# Patient Record
Sex: Male | Born: 1935 | Race: White | Hispanic: No | Marital: Married | Smoking: Former smoker
Health system: Southern US, Community
[De-identification: ages and names within clinical notes are randomized; demographics above are authoritative.]

## PROBLEM LIST (undated history)

## (undated) DIAGNOSIS — K219 Gastro-esophageal reflux disease without esophagitis: Secondary | ICD-10-CM

## (undated) DIAGNOSIS — N189 Chronic kidney disease, unspecified: Secondary | ICD-10-CM

## (undated) DIAGNOSIS — D649 Anemia, unspecified: Secondary | ICD-10-CM

## (undated) DIAGNOSIS — E7800 Pure hypercholesterolemia, unspecified: Secondary | ICD-10-CM

## (undated) DIAGNOSIS — Z950 Presence of cardiac pacemaker: Secondary | ICD-10-CM

## (undated) DIAGNOSIS — I739 Peripheral vascular disease, unspecified: Secondary | ICD-10-CM

## (undated) DIAGNOSIS — N529 Male erectile dysfunction, unspecified: Secondary | ICD-10-CM

## (undated) DIAGNOSIS — R0600 Dyspnea, unspecified: Secondary | ICD-10-CM

## (undated) DIAGNOSIS — E119 Type 2 diabetes mellitus without complications: Secondary | ICD-10-CM

## (undated) DIAGNOSIS — K5790 Diverticulosis of intestine, part unspecified, without perforation or abscess without bleeding: Secondary | ICD-10-CM

## (undated) HISTORY — DX: Male erectile dysfunction, unspecified: N52.9

## (undated) HISTORY — PX: OTHER SURGICAL HISTORY: SHX169

## (undated) HISTORY — DX: Anemia, unspecified: D64.9

## (undated) HISTORY — DX: Chronic kidney disease, unspecified: N18.9

## (undated) HISTORY — DX: Diverticulosis of intestine, part unspecified, without perforation or abscess without bleeding: K57.90

---

## 2005-01-11 ENCOUNTER — Ambulatory Visit: Payer: Self-pay | Admitting: Gastroenterology

## 2005-02-02 ENCOUNTER — Ambulatory Visit: Payer: Self-pay | Admitting: Gastroenterology

## 2008-01-08 ENCOUNTER — Ambulatory Visit: Payer: Self-pay | Admitting: Gastroenterology

## 2008-04-02 ENCOUNTER — Ambulatory Visit: Payer: Self-pay | Admitting: Unknown Physician Specialty

## 2009-10-05 ENCOUNTER — Ambulatory Visit: Payer: Self-pay | Admitting: Ophthalmology

## 2009-12-27 ENCOUNTER — Ambulatory Visit: Payer: Self-pay | Admitting: Cardiovascular Disease

## 2009-12-27 ENCOUNTER — Ambulatory Visit: Payer: Self-pay | Admitting: Urology

## 2010-01-04 ENCOUNTER — Ambulatory Visit: Payer: Self-pay | Admitting: Internal Medicine

## 2010-01-05 ENCOUNTER — Ambulatory Visit: Payer: Self-pay | Admitting: Internal Medicine

## 2010-01-12 ENCOUNTER — Ambulatory Visit: Payer: Self-pay | Admitting: Urology

## 2010-02-01 ENCOUNTER — Ambulatory Visit: Payer: Self-pay | Admitting: Internal Medicine

## 2011-09-20 ENCOUNTER — Ambulatory Visit: Payer: Self-pay | Admitting: Internal Medicine

## 2011-09-28 ENCOUNTER — Ambulatory Visit: Payer: Self-pay | Admitting: Internal Medicine

## 2011-10-30 ENCOUNTER — Ambulatory Visit: Payer: Self-pay | Admitting: Gastroenterology

## 2011-11-02 LAB — PATHOLOGY REPORT

## 2012-12-04 HISTORY — PX: HERNIA REPAIR: SHX51

## 2013-11-11 ENCOUNTER — Ambulatory Visit: Payer: Self-pay | Admitting: General Surgery

## 2013-12-15 ENCOUNTER — Ambulatory Visit: Payer: Self-pay | Admitting: Surgery

## 2013-12-15 LAB — CBC WITH DIFFERENTIAL/PLATELET
BASOS ABS: 0 10*3/uL (ref 0.0–0.1)
Basophil %: 0.7 %
EOS ABS: 0.1 10*3/uL (ref 0.0–0.7)
Eosinophil %: 1.4 %
HCT: 36.1 % — ABNORMAL LOW (ref 40.0–52.0)
HGB: 12.3 g/dL — ABNORMAL LOW (ref 13.0–18.0)
Lymphocyte #: 1.7 10*3/uL (ref 1.0–3.6)
Lymphocyte %: 32.7 %
MCH: 33.3 pg (ref 26.0–34.0)
MCHC: 34.1 g/dL (ref 32.0–36.0)
MCV: 98 fL (ref 80–100)
MONOS PCT: 8.8 %
Monocyte #: 0.4 x10 3/mm (ref 0.2–1.0)
NEUTROS ABS: 2.9 10*3/uL (ref 1.4–6.5)
Neutrophil %: 56.4 %
RBC: 3.7 10*6/uL — ABNORMAL LOW (ref 4.40–5.90)
RDW: 13.8 % (ref 11.5–14.5)
WBC: 5.1 10*3/uL (ref 3.8–10.6)

## 2013-12-15 LAB — BASIC METABOLIC PANEL
ANION GAP: 3 — AB (ref 7–16)
BUN: 14 mg/dL (ref 7–18)
CO2: 28 mmol/L (ref 21–32)
Calcium, Total: 9 mg/dL (ref 8.5–10.1)
Chloride: 108 mmol/L — ABNORMAL HIGH (ref 98–107)
Creatinine: 1.25 mg/dL (ref 0.60–1.30)
EGFR (African American): >60
EGFR (Non-African Amer.): 54 — ABNORMAL LOW
Glucose: 84 mg/dL (ref 65–99)
Osmolality: 277 (ref 275–301)
POTASSIUM: 4.2 mmol/L (ref 3.5–5.1)
Sodium: 139 mmol/L (ref 136–145)

## 2013-12-22 ENCOUNTER — Ambulatory Visit: Payer: Self-pay | Admitting: Surgery

## 2014-04-08 DIAGNOSIS — E782 Mixed hyperlipidemia: Secondary | ICD-10-CM | POA: Insufficient documentation

## 2014-04-08 DIAGNOSIS — K2100 Gastro-esophageal reflux disease with esophagitis, without bleeding: Secondary | ICD-10-CM | POA: Insufficient documentation

## 2014-10-14 DIAGNOSIS — D500 Iron deficiency anemia secondary to blood loss (chronic): Secondary | ICD-10-CM | POA: Insufficient documentation

## 2015-03-27 NOTE — Op Note (Signed)
PATIENT NAME:  Lance Diaz, Lance Diaz MR#:  [id number] DATE OF BIRTH:  13-Nov-1931  DATE OF PROCEDURE:  12/22/2013  PREOPERATIVE DIAGNOSIS: Symptomatic right inguinal hernia.   POSTOPERATIVE DIAGNOSIS: Right indirect inguinal hernia.   PROCEDURE PERFORMED: Robotically assisted laparoscopic right inguinal herniorrhaphy with mesh.   SURGEON: Sherri Rad, M.D.   ASSISTANT: Scrub techs   ANESTHESIA: General endotracheal and local.   ESTIMATED BLOOD LOSS: 25 mL.   DESCRIPTION OF PROCEDURE: With informed consent, supine position, general oral endotracheal anesthesia, the patient's abdomen was clipped of hair, prepped and draped with ChloraPrep solution. A Foley catheter was placed. The left arm was padded and tucked at his side. Timeout was observed.   A 12 mm blunt Hassan trocar was placed through a supraumbilical transversely oriented skin incision with stay sutures being passed through the fascia. Pneumoperitoneum was established. The patient was then positioned in Trendelenburg position. Right indirect inguinal hernia was identified. No evidence of a left inguinal hernia. Intra-abdominal organs to gross inspection appeared normal. An 8.5 mm da Vinci trocar was placed laterally on the aright side of the abdomen, 10 cm from the midline. An additional 8.5 mm trocar da Vinci was placed in the left mid abdomen at the umbilical line. A 10 mm Bladeless trocar was placed in the left upper quadrant for the assistant port. The robot was then docked. Instruments were then inserted under direct visualization. Remote sensors were identified and secured in the proper location. Camera was secured. Instruments were then advanced into the operative field under direct visualization. I then moved to the console.   The peritoneum beginning along the medial umbilical ligament was taken down from a medial to lateral orientation. The preperitoneal space was entered. Cooper's ligament was identified. Epigastric vessels were also  identified and preserved. There was no evidence of a direct inguinal hernia.   Dissection laterally demonstrated the course of the nerve. Muscle was identified. Sufficient preperitoneal dissection was undertaken to allow placement of the mesh. The vas and vessels were identified, as well as a large indirect inguinal hernia sac which was able to be reduced back out of the internal ring with gentle traction. Hernia sac was allowed to be reduced back into the abdominal space. Course of the vas and vessels were identified.   Medium 3-D Max Bard right-sided hernia mesh was brought into the field and inserted through the first assistant port. It was then unfurled with orientation for the right side. An 0 Vicryl suture was used to tack the mesh at two places along the shelving edge of Cooper's ligament. Two stitches were then placed in the anterior abdominal wall. Stitch was placed lateral to the epigastric vessels. No sutures were placed in the area of the nerve. A Quill suture was then used to reapproximate the peritoneum obliterating the mesh from the abdominal space. Ports were then removed under direct visualization. The supraumbilical fascial defect was reapproximated with an additional figure-of-eight 0 Vicryl suture in vertical orientation. The existing stay sutures tied to each other. A total of 30 mL of 0.25% plain Marcaine was infiltrated along all skin and fascial incisions prior to closure and 4-0 Monocryl was used to reapproximate skin edges in a subcuticular fashion. The patient was then subsequently extubated after sterile dressings were placed and Foley catheter was removed. He was transferred to the recovery room in stable and satisfactory condition by anesthesia services    ____________________________ Jeannette How. Marina Gravel, MD mab:dp D: 12/22/2013 17:19:00 ET T: 12/23/2013 05:55:52 ET JOB#: [id number]  cc: Elta Guadeloupe A. Marina Gravel, MD, <Dictator> Hortencia Conradi MD ELECTRONICALLY SIGNED 12/28/2013 11:53

## 2015-04-14 DIAGNOSIS — E119 Type 2 diabetes mellitus without complications: Secondary | ICD-10-CM | POA: Insufficient documentation

## 2015-05-27 DIAGNOSIS — I10 Essential (primary) hypertension: Secondary | ICD-10-CM | POA: Insufficient documentation

## 2015-05-27 DIAGNOSIS — I6523 Occlusion and stenosis of bilateral carotid arteries: Secondary | ICD-10-CM | POA: Insufficient documentation

## 2015-08-14 ENCOUNTER — Emergency Department
Admission: EM | Admit: 2015-08-14 | Discharge: 2015-08-14 | Disposition: A | Discharge status: Home / Self Care | Payer: Medicare Other | Attending: Emergency Medicine | Admitting: Emergency Medicine

## 2015-08-14 ENCOUNTER — Encounter: Payer: Self-pay | Admitting: Emergency Medicine

## 2015-08-14 ENCOUNTER — Emergency Department: Payer: Medicare Other

## 2015-08-14 DIAGNOSIS — Z87891 Personal history of nicotine dependence: Secondary | ICD-10-CM | POA: Diagnosis not present

## 2015-08-14 DIAGNOSIS — M549 Dorsalgia, unspecified: Secondary | ICD-10-CM | POA: Diagnosis not present

## 2015-08-14 DIAGNOSIS — E119 Type 2 diabetes mellitus without complications: Secondary | ICD-10-CM | POA: Insufficient documentation

## 2015-08-14 DIAGNOSIS — R109 Unspecified abdominal pain: Secondary | ICD-10-CM | POA: Diagnosis present

## 2015-08-14 DIAGNOSIS — R112 Nausea with vomiting, unspecified: Secondary | ICD-10-CM | POA: Insufficient documentation

## 2015-08-14 HISTORY — DX: Type 2 diabetes mellitus without complications: E11.9

## 2015-08-14 HISTORY — DX: Pure hypercholesterolemia, unspecified: E78.00

## 2015-08-14 LAB — COMPREHENSIVE METABOLIC PANEL
ALBUMIN: 3.9 g/dL (ref 3.5–5.0)
ALT: 11 U/L — AB (ref 17–63)
AST: 19 U/L (ref 15–41)
Alkaline Phosphatase: 69 U/L (ref 38–126)
Anion gap: 5 (ref 5–15)
BUN: 14 mg/dL (ref 6–20)
CHLORIDE: 107 mmol/L (ref 101–111)
CO2: 28 mmol/L (ref 22–32)
CREATININE: 1.33 mg/dL — AB (ref 0.61–1.24)
Calcium: 9 mg/dL (ref 8.9–10.3)
GFR calc Af Amer: 56 mL/min — ABNORMAL LOW (ref >60)
GFR, EST NON AFRICAN AMERICAN: 48 mL/min — AB (ref >60)
Glucose, Bld: 114 mg/dL — ABNORMAL HIGH (ref 65–99)
POTASSIUM: 4.1 mmol/L (ref 3.5–5.1)
SODIUM: 140 mmol/L (ref 135–145)
Total Bilirubin: 1 mg/dL (ref 0.3–1.2)
Total Protein: 7.4 g/dL (ref 6.5–8.1)

## 2015-08-14 LAB — URINALYSIS COMPLETE WITH MICROSCOPIC (ARMC ONLY)
BACTERIA UA: NONE SEEN
BILIRUBIN URINE: NEGATIVE
GLUCOSE, UA: NEGATIVE mg/dL
Ketones, ur: NEGATIVE mg/dL
Leukocytes, UA: NEGATIVE
NITRITE: NEGATIVE
Protein, ur: NEGATIVE mg/dL
SPECIFIC GRAVITY, URINE: 1.011 (ref 1.005–1.030)
Squamous Epithelial / LPF: NONE SEEN
pH: 6 (ref 5.0–8.0)

## 2015-08-14 LAB — CBC
HEMATOCRIT: 38 % — AB (ref 40.0–52.0)
Hemoglobin: 12.6 g/dL — ABNORMAL LOW (ref 13.0–18.0)
MCH: 31.9 pg (ref 26.0–34.0)
MCHC: 33.2 g/dL (ref 32.0–36.0)
MCV: 96.1 fL (ref 80.0–100.0)
Platelets: 250 10*3/uL (ref 150–440)
RBC: 3.95 MIL/uL — ABNORMAL LOW (ref 4.40–5.90)
RDW: 13.4 % (ref 11.5–14.5)
WBC: 5.8 10*3/uL (ref 3.8–10.6)

## 2015-08-14 LAB — LIPASE, BLOOD: Lipase: 21 U/L — ABNORMAL LOW (ref 22–51)

## 2015-08-14 MED ORDER — TRAMADOL HCL 50 MG PO TABS: 50.0000 mg | ORAL_TABLET | Freq: Four times a day (QID) | ORAL | Status: DC | PRN

## 2015-08-14 MED ORDER — TRAMADOL HCL 50 MG PO TABS
50.0000 mg | ORAL_TABLET | Freq: Once | ORAL | Status: AC
  Administered 2015-08-14: 50 mg via ORAL
  Filled 2015-08-14: qty 1

## 2015-08-14 NOTE — Discharge Instructions (Signed)
Please follow-up with your primary care doctor on Monday for recheck. Return to the emergency department for any worsening pain, vomiting, fever, or any other symptom personally concerning to yourself.    Flank Pain Flank pain refers to pain that is located on the side of the body between the upper abdomen and the back. The pain may occur over a short period of time (acute) or may be long-term or reoccurring (chronic). It may be mild or severe. Flank pain can be caused by many things. CAUSES  Some of the more common causes of flank pain include:  Muscle strains.   Muscle spasms.   A disease of your spine (vertebral disk disease).   A lung infection (pneumonia).   Fluid around your lungs (pulmonary edema).   A kidney infection.   Kidney stones.   A very painful skin rash caused by the chickenpox virus (shingles).   Gallbladder disease.  [place] care will depend on the cause of your pain. In general,  Rest as directed by your caregiver.  Drink enough fluids to keep your urine clear or pale yellow.  Only take over-the-counter or prescription medicines as directed by your caregiver. Some medicines may help relieve the pain.  Tell your caregiver about any changes in your pain.  Follow up with your caregiver as directed. SEEK IMMEDIATE MEDICAL CARE IF:   Your pain is not controlled with medicine.   You have new or worsening symptoms.  Your pain increases.   You have abdominal pain.   You have shortness of breath.   You have persistent nausea or vomiting.   You have swelling in your abdomen.   You feel faint or pass out.   You have blood in your urine.  You have a fever or persistent symptoms for more than 2-3 days.  You have a fever and your symptoms suddenly get worse. MAKE SURE YOU:   Understand these instructions.  Will watch your condition.  Will get help right away if you are not doing well or get worse. Document  Released: 01/11/2006 Document Revised: 08/14/2012 Document Reviewed: 07/04/2012 [facility] Patient Information 2015 [place], [place]. This information is not intended to replace advice given to you by your health care provider. Make sure you discuss any questions you have with your health care provider.

## 2015-08-14 NOTE — ED Notes (Signed)
Pt to ed with c/o right lower abd pain and right groin pain that radiates around to right flank pain x 2 days.  Pt denies n/v/d.

## 2015-08-14 NOTE — ED Provider Notes (Signed)
[facility] Emergency Department Provider Note  Time seen: 10:50 AM  I have reviewed the triage vital signs and the nursing notes.   HISTORY  Chief Complaint Abdominal Pain    HPI Lance Diaz is a 81 y.o. male with a past medical history of hyperlipidemia, diabetes who presents the emergency department with right flank pain. According to the patient for the past 5 days he has had intermittent right flank pain. He states initially 5 days ago it was severe, with nausea and vomiting, but then got better however beginning yesterday came back again. Denies any dark urine, bloody urine, dysuria, fever, diarrhea or constipation. Felt nauseated again today but has not vomited. Describes the pain as sharp, 10/10 during pain episodes, 1/10 currently. Has not noted any modifying factors.    Past Medical History  Diagnosis Date  . High cholesterol   . Diabetes mellitus without complication     There are no active problems to display for this patient.   History reviewed. No pertinent past surgical history.  No current outpatient prescriptions on file.  Allergies Review of patient's allergies indicates no known allergies.  No family history on file.  Social History Social History  Substance Use Topics  . Smoking status: Former Research scientist (life sciences)  . Smokeless tobacco: None  . Alcohol Use: No    Review of Systems Constitutional: Negative for fever. Cardiovascular: Negative for chest pain. Respiratory: Negative for shortness of breath. Gastrointestinal: Right flank pain. Positive for nausea and vomiting. Genitourinary: Negative for dysuria. Musculoskeletal: Right back pain. 10-point ROS otherwise negative.  ____________________________________________   PHYSICAL EXAM:  VITAL SIGNS: ED Triage Vitals  Enc Vitals Group     BP 08/14/15 0935 148/85 mmHg     Pulse Rate 08/14/15 0935 56     Resp 08/14/15 0935 20     Temp 08/14/15 0935 97.5 F (36.4 C)   Temp Source 08/14/15 0935 Oral     SpO2 08/14/15 0935 95 %     Weight 08/14/15 0935 196 lb (88.905 kg)     Height 08/14/15 0935 6\' 2"  (1.88 m)     Head Cir --      Peak Flow --      Pain Score 08/14/15 0936 10     Pain Loc --      Pain Edu? --      Excl. in [place]? --     Constitutional: Alert and oriented. Well appearing and in no distress. ENT   Head: Normocephalic and atraumatic. Cardiovascular: Normal rate, regular rhythm.  Respiratory: Normal respiratory effort without tachypnea nor retractions. Breath sounds are clear and equal bilaterally. No wheezes/rales/rhonchi. Gastrointestinal: Soft, nontender on exam. No distention. No CVA tenderness on exam. Musculoskeletal: Nontender with normal range of motion in all extremities. Neurologic:  Normal speech and language. No gross focal neurologic deficits Skin:  Skin is warm, dry and intact.  Psychiatric: Mood and affect are normal. Speech and behavior are normal.   ____________________________________________   RADIOLOGY  CT within normal limits  ____________________________________________    INITIAL IMPRESSION / ASSESSMENT AND PLAN / ED COURSE  Pertinent labs & imaging results that were available during my care of the patient were reviewed by me and considered in my medical decision making (see chart for details).  Patient with intermittent right flank pain 5 days. It has returned and worsened over the past 2 days of the patient in the emergency department for evaluation. Given his description of pain, with red blood cells in  his urine, highly suspect ureterolithiasis. Patient has no history of kidney stones previously. We will obtain a noncontrasted CT scan to further evaluate. Patient denies much pain at this time, does not wish for pain medication.  Labs are within normal limits besides a small amount of blood in the urine, CT scan is within normal limits no stones seen. Patient states this discomfort is improved, we will  discharge on Ultram as needed, with follow-up with his primary care physician. Patient agreeable to plan.  ____________________________________________   FINAL CLINICAL IMPRESSION(S) / ED DIAGNOSES  Right flank pain   Harvest Dark, MD 08/14/15 1214

## 2015-10-18 DIAGNOSIS — N183 Chronic kidney disease, stage 3 unspecified: Secondary | ICD-10-CM | POA: Insufficient documentation

## 2016-05-16 ENCOUNTER — Encounter
Admission: RE | Admit: 2016-05-16 | Discharge: 2016-05-16 | Disposition: A | Discharge status: Home / Self Care | Payer: Medicare Other | Source: Ambulatory Visit | Attending: Cardiology | Admitting: Cardiology

## 2016-05-16 ENCOUNTER — Ambulatory Visit
Admission: RE | Admit: 2016-05-16 | Discharge: 2016-05-16 | Disposition: A | Discharge status: Home / Self Care | Payer: Medicare Other | Source: Ambulatory Visit | Attending: Cardiology | Admitting: Cardiology

## 2016-05-16 DIAGNOSIS — I10 Essential (primary) hypertension: Secondary | ICD-10-CM | POA: Diagnosis not present

## 2016-05-16 DIAGNOSIS — R0602 Shortness of breath: Secondary | ICD-10-CM

## 2016-05-16 DIAGNOSIS — Z01818 Encounter for other preprocedural examination: Secondary | ICD-10-CM | POA: Diagnosis present

## 2016-05-16 HISTORY — DX: Peripheral vascular disease, unspecified: I73.9

## 2016-05-16 HISTORY — DX: Gastro-esophageal reflux disease without esophagitis: K21.9

## 2016-05-16 LAB — APTT: APTT: 32 s (ref 24–36)

## 2016-05-16 LAB — BASIC METABOLIC PANEL
Anion gap: 8 (ref 5–15)
BUN: 15 mg/dL (ref 6–20)
CALCIUM: 9.3 mg/dL (ref 8.9–10.3)
CO2: 26 mmol/L (ref 22–32)
CREATININE: 1.43 mg/dL — AB (ref 0.61–1.24)
Chloride: 107 mmol/L (ref 101–111)
GFR calc non Af Amer: 44 mL/min — ABNORMAL LOW (ref >60)
GFR, EST AFRICAN AMERICAN: 51 mL/min — AB (ref >60)
GLUCOSE: 73 mg/dL (ref 65–99)
Potassium: 4.1 mmol/L (ref 3.5–5.1)
Sodium: 141 mmol/L (ref 135–145)

## 2016-05-16 LAB — CBC
HEMATOCRIT: 37 % — AB (ref 40.0–52.0)
Hemoglobin: 12.3 g/dL — ABNORMAL LOW (ref 13.0–18.0)
MCH: 31.8 pg (ref 26.0–34.0)
MCHC: 33.2 g/dL (ref 32.0–36.0)
MCV: 95.9 fL (ref 80.0–100.0)
PLATELETS: 220 10*3/uL (ref 150–440)
RBC: 3.86 MIL/uL — ABNORMAL LOW (ref 4.40–5.90)
RDW: 14.3 % (ref 11.5–14.5)
WBC: 5.8 10*3/uL (ref 3.8–10.6)

## 2016-05-16 LAB — DIFFERENTIAL
BASOS ABS: 0 10*3/uL (ref 0–0.1)
BASOS PCT: 1 %
EOS ABS: 0.1 10*3/uL (ref 0–0.7)
Eosinophils Relative: 2 %
Lymphocytes Relative: 23 %
Lymphs Abs: 1.3 10*3/uL (ref 1.0–3.6)
MONO ABS: 0.5 10*3/uL (ref 0.2–1.0)
MONOS PCT: 9 %
NEUTROS ABS: 3.8 10*3/uL (ref 1.4–6.5)
Neutrophils Relative %: 65 %

## 2016-05-16 LAB — PROTIME-INR
INR: 1.06
Prothrombin Time: 14 seconds (ref 11.4–15.0)

## 2016-05-16 LAB — SURGICAL PCR SCREEN
MRSA, PCR: NEGATIVE
Staphylococcus aureus: NEGATIVE

## 2016-05-16 NOTE — Patient Instructions (Signed)
  Your procedure is scheduled on: Wednesday May 24, 2016. Report to Same Day Surgery. To find out your arrival time please call [phone] between 1PM - 3PM on Tuesday May 23, 2016.  Remember: Instructions that are not followed completely may result in serious medical risk, up to and including death, or upon the discretion of your surgeon and anesthesiologist your surgery may need to be rescheduled.    _x___ 1. Do not eat food or drink liquids after midnight. No gum chewing or hard candies.     ____ 2. No Alcohol for 24 hours before or after surgery.   ____ 3. Bring all medications with you on the day of surgery if instructed.    __x__ 4. Notify your doctor if there is any change in your medical condition     (cold, fever, infections).     Do not wear jewelry, make-up, hairpins, clips or nail polish.  Do not wear lotions, powders, or perfumes. You may wear deodorant.  Do not shave 48 hours prior to surgery. Men may shave face and neck.  Do not bring valuables to the hospital.    [facility] is not responsible for any belongings or valuables.               Contacts, dentures or bridgework may not be worn into surgery.  Leave your suitcase in the car. After surgery it may be brought to your room.  For patients admitted to the hospital, discharge time is determined by your treatment team.   Patients discharged the day of surgery will not be allowed to drive home.    Please read over the following fact sheets that you were given:   [facility] Preparing for Surgery  __x__ Take these medicines the morning of surgery with A SIP OF WATER:    1. amLODipine (NORVASC)  2. Venturia    ____ Fleet Enema (as directed)   _x___ Use CHG Soap as directed on instruction sheet  ____ Use inhalers on the day of surgery and bring to hospital day of surgery  _x__ Stop metformin 2 days prior to surgery on May 22, 2016.    ____ Take 1/2 of usual insulin dose the night before surgery and none  on the morning of          surgery.   _x___ Stop aspirin 5 days prior to surgery.  _x___ Stop Anti-inflammatories such as Advil, Aleve, Ibuprofen, Motrin, Naproxen, Naprosyn, Goodies powders or aspirin products.OK to Tylenol.   __x__ Stop supplements:Iron-Vitamin C (VITRON-C)  until after surgery.    ____ Bring C-Pap to the hospital.

## 2016-05-17 NOTE — Pre-Procedure Instructions (Signed)
Labs faxed to dr Saralyn Pilar

## 2016-05-23 MED ORDER — CEFAZOLIN SODIUM-DEXTROSE 2-4 GM/100ML-% IV SOLN: 2.0000 g | INTRAVENOUS | Status: DC

## 2016-05-24 ENCOUNTER — Ambulatory Visit
Admission: RE | Admit: 2016-05-24 | Discharge: 2016-05-25 | Disposition: A | Discharge status: Home / Self Care | Payer: Medicare Other | Source: Ambulatory Visit | Attending: Cardiology | Admitting: Cardiology

## 2016-05-24 ENCOUNTER — Inpatient Hospital Stay: Payer: Medicare Other | Admitting: Anesthesiology

## 2016-05-24 ENCOUNTER — Encounter
Admission: RE | Disposition: A | Discharge status: Home / Self Care | Payer: Self-pay | Source: Ambulatory Visit | Attending: Cardiology

## 2016-05-24 ENCOUNTER — Inpatient Hospital Stay: Payer: Medicare Other

## 2016-05-24 ENCOUNTER — Encounter: Payer: Self-pay | Admitting: *Deleted

## 2016-05-24 ENCOUNTER — Observation Stay: Payer: Medicare Other

## 2016-05-24 DIAGNOSIS — I491 Atrial premature depolarization: Secondary | ICD-10-CM | POA: Insufficient documentation

## 2016-05-24 DIAGNOSIS — N189 Chronic kidney disease, unspecified: Secondary | ICD-10-CM | POA: Diagnosis not present

## 2016-05-24 DIAGNOSIS — Z7984 Long term (current) use of oral hypoglycemic drugs: Secondary | ICD-10-CM | POA: Insufficient documentation

## 2016-05-24 DIAGNOSIS — K5790 Diverticulosis of intestine, part unspecified, without perforation or abscess without bleeding: Secondary | ICD-10-CM | POA: Diagnosis not present

## 2016-05-24 DIAGNOSIS — Z8042 Family history of malignant neoplasm of prostate: Secondary | ICD-10-CM | POA: Diagnosis not present

## 2016-05-24 DIAGNOSIS — Z841 Family history of disorders of kidney and ureter: Secondary | ICD-10-CM | POA: Insufficient documentation

## 2016-05-24 DIAGNOSIS — R001 Bradycardia, unspecified: Secondary | ICD-10-CM | POA: Insufficient documentation

## 2016-05-24 DIAGNOSIS — K219 Gastro-esophageal reflux disease without esophagitis: Secondary | ICD-10-CM | POA: Insufficient documentation

## 2016-05-24 DIAGNOSIS — I129 Hypertensive chronic kidney disease with stage 1 through stage 4 chronic kidney disease, or unspecified chronic kidney disease: Secondary | ICD-10-CM | POA: Insufficient documentation

## 2016-05-24 DIAGNOSIS — I4589 Other specified conduction disorders: Secondary | ICD-10-CM | POA: Diagnosis not present

## 2016-05-24 DIAGNOSIS — Z950 Presence of cardiac pacemaker: Secondary | ICD-10-CM

## 2016-05-24 DIAGNOSIS — D649 Anemia, unspecified: Secondary | ICD-10-CM | POA: Insufficient documentation

## 2016-05-24 DIAGNOSIS — Z8249 Family history of ischemic heart disease and other diseases of the circulatory system: Secondary | ICD-10-CM | POA: Diagnosis not present

## 2016-05-24 DIAGNOSIS — Z79899 Other long term (current) drug therapy: Secondary | ICD-10-CM | POA: Diagnosis not present

## 2016-05-24 DIAGNOSIS — I495 Sick sinus syndrome: Principal | ICD-10-CM | POA: Diagnosis present

## 2016-05-24 DIAGNOSIS — E785 Hyperlipidemia, unspecified: Secondary | ICD-10-CM | POA: Diagnosis not present

## 2016-05-24 DIAGNOSIS — Z8371 Family history of colonic polyps: Secondary | ICD-10-CM | POA: Insufficient documentation

## 2016-05-24 DIAGNOSIS — I493 Ventricular premature depolarization: Secondary | ICD-10-CM | POA: Insufficient documentation

## 2016-05-24 DIAGNOSIS — E1122 Type 2 diabetes mellitus with diabetic chronic kidney disease: Secondary | ICD-10-CM | POA: Insufficient documentation

## 2016-05-24 DIAGNOSIS — I5189 Other ill-defined heart diseases: Secondary | ICD-10-CM

## 2016-05-24 DIAGNOSIS — Z888 Allergy status to other drugs, medicaments and biological substances status: Secondary | ICD-10-CM | POA: Insufficient documentation

## 2016-05-24 HISTORY — PX: PACEMAKER INSERTION: SHX728

## 2016-05-24 LAB — GLUCOSE, CAPILLARY
Glucose-Capillary: 86 mg/dL (ref 65–99)
Glucose-Capillary: 98 mg/dL (ref 65–99)

## 2016-05-24 SURGERY — INSERTION, CARDIAC PACEMAKER
Anesthesia: Monitor Anesthesia Care | Site: Shoulder | Laterality: Left | Wound class: Clean

## 2016-05-24 MED ORDER — ONDANSETRON HCL 4 MG/2ML IJ SOLN: 4.0000 mg | Freq: Once | INTRAMUSCULAR | Status: DC | PRN

## 2016-05-24 MED ORDER — ONDANSETRON HCL 4 MG/2ML IJ SOLN: 4.0000 mg | Freq: Four times a day (QID) | INTRAMUSCULAR | Status: DC | PRN

## 2016-05-24 MED ORDER — SODIUM CHLORIDE 0.9 % IR SOLN
Status: DC | PRN
  Administered 2016-05-24: 200 mL

## 2016-05-24 MED ORDER — CEFAZOLIN IN D5W 1 GM/50ML IV SOLN
INTRAVENOUS | Status: AC
  Administered 2016-05-24: 15:00:00
  Filled 2016-05-24: qty 50

## 2016-05-24 MED ORDER — SODIUM CHLORIDE 0.9 % IJ SOLN
INTRAMUSCULAR | Status: DC | PRN
  Administered 2016-05-24: 2 mL via INTRAVENOUS

## 2016-05-24 MED ORDER — SODIUM CHLORIDE 0.9 % IV SOLN
INTRAVENOUS | Status: DC
  Administered 2016-05-24 (×3): via INTRAVENOUS

## 2016-05-24 MED ORDER — FENTANYL CITRATE (PF) 100 MCG/2ML IJ SOLN: 25.0000 ug | INTRAMUSCULAR | Status: DC | PRN

## 2016-05-24 MED ORDER — LIDOCAINE 1 % OPTIME INJ - NO CHARGE
INTRAMUSCULAR | Status: DC | PRN
  Administered 2016-05-24: 28 mL

## 2016-05-24 MED ORDER — GENTAMICIN SULFATE 40 MG/ML IJ SOLN
INTRAMUSCULAR | Status: AC
  Filled 2016-05-24: qty 2

## 2016-05-24 MED ORDER — PROPOFOL 500 MG/50ML IV EMUL
INTRAVENOUS | Status: DC | PRN
  Administered 2016-05-24: 75 ug/kg/min via INTRAVENOUS

## 2016-05-24 MED ORDER — METFORMIN HCL 500 MG PO TABS
500.0000 mg | ORAL_TABLET | Freq: Two times a day (BID) | ORAL | Status: DC
  Administered 2016-05-24 – 2016-05-25 (×2): 500 mg via ORAL
  Filled 2016-05-24 (×2): qty 1

## 2016-05-24 MED ORDER — CEFAZOLIN SODIUM 1-5 GM-% IV SOLN: 1.0000 g | Freq: Once | INTRAVENOUS | Status: DC

## 2016-05-24 MED ORDER — SODIUM CHLORIDE 0.9 % IR SOLN
80.0000 mg | Status: DC
  Filled 2016-05-24: qty 2

## 2016-05-24 MED ORDER — AMLODIPINE BESYLATE 5 MG PO TABS
2.5000 mg | ORAL_TABLET | Freq: Every day | ORAL | Status: DC
  Administered 2016-05-25: 2.5 mg via ORAL
  Filled 2016-05-24 (×2): qty 1

## 2016-05-24 MED ORDER — SODIUM CHLORIDE 0.9 % IV SOLN
INTRAVENOUS | Status: DC
  Administered 2016-05-24: 12:00:00 via INTRAVENOUS

## 2016-05-24 MED ORDER — ACETAMINOPHEN 325 MG PO TABS
325.0000 mg | ORAL_TABLET | ORAL | Status: DC | PRN
  Administered 2016-05-24: 650 mg via ORAL
  Filled 2016-05-24: qty 2

## 2016-05-24 MED ORDER — SODIUM CHLORIDE 0.9 % IJ SOLN
INTRAMUSCULAR | Status: AC
  Filled 2016-05-24: qty 50

## 2016-05-24 MED ORDER — MIDAZOLAM HCL 2 MG/2ML IJ SOLN
INTRAMUSCULAR | Status: DC | PRN
  Administered 2016-05-24: 2 mg via INTRAVENOUS

## 2016-05-24 MED ORDER — ATORVASTATIN CALCIUM 20 MG PO TABS
20.0000 mg | ORAL_TABLET | Freq: Every day | ORAL | Status: DC
  Administered 2016-05-24: 20 mg via ORAL
  Filled 2016-05-24: qty 1

## 2016-05-24 MED ORDER — CEFAZOLIN IN D5W 1 GM/50ML IV SOLN
1.0000 g | Freq: Four times a day (QID) | INTRAVENOUS | Status: AC
  Administered 2016-05-24 (×3): 1 g via INTRAVENOUS
  Filled 2016-05-24 (×3): qty 50

## 2016-05-24 SURGICAL SUPPLY — 37 items
BAG DECANTER FOR FLEXI CONT (MISCELLANEOUS) ×3 IMPLANT
BRUSH SCRUB 4% CHG (MISCELLANEOUS) ×3 IMPLANT
CABLE SURG 12 DISP A/V CHANNEL (MISCELLANEOUS) ×3 IMPLANT
CANISTER SUCT 1200ML W/VALVE (MISCELLANEOUS) ×3 IMPLANT
CHLORAPREP W/TINT 26ML (MISCELLANEOUS) ×3 IMPLANT
COVER LIGHT HANDLE STERIS (MISCELLANEOUS) ×6 IMPLANT
COVER MAYO STAND STRL (DRAPES) ×3 IMPLANT
DEVICE DISSECT PLASMABLAD 3.0S (MISCELLANEOUS) ×1 IMPLANT
DRAPE C-ARM XRAY 36X54 (DRAPES) ×3 IMPLANT
DRESSING TELFA 4X3 1S ST N-ADH (GAUZE/BANDAGES/DRESSINGS) ×3 IMPLANT
DRSG TEGADERM 4X4.75 (GAUZE/BANDAGES/DRESSINGS) ×3 IMPLANT
ELECT REM PT RETURN 9FT ADLT (ELECTROSURGICAL) ×3
ELECTRODE REM PT RTRN 9FT ADLT (ELECTROSURGICAL) ×1 IMPLANT
GLOVE BIO SURGEON STRL SZ 6.5 (GLOVE) ×2 IMPLANT
GLOVE BIO SURGEON STRL SZ7.5 (GLOVE) ×3 IMPLANT
GLOVE BIO SURGEON STRL SZ8 (GLOVE) ×3 IMPLANT
GLOVE BIO SURGEONS STRL SZ 6.5 (GLOVE) ×1
GOWN STRL REUS W/ TWL LRG LVL3 (GOWN DISPOSABLE) ×1 IMPLANT
GOWN STRL REUS W/ TWL XL LVL3 (GOWN DISPOSABLE) ×1 IMPLANT
GOWN STRL REUS W/TWL LRG LVL3 (GOWN DISPOSABLE) ×2
GOWN STRL REUS W/TWL XL LVL3 (GOWN DISPOSABLE) ×2
IMMOBILIZER SHDR MD LX WHT (SOFTGOODS) ×3 IMPLANT
IMMOBILIZER SHDR XL LX WHT (SOFTGOODS) IMPLANT
INTRO PACEMKR SHEATH II 7FR (MISCELLANEOUS) ×3
INTRODUCER PACEMKR SHTH II 7FR (MISCELLANEOUS) ×1 IMPLANT
IV NS 500ML (IV SOLUTION) ×2
IV NS 500ML BAXH (IV SOLUTION) ×1 IMPLANT
KIT RM TURNOVER STRD PROC AR (KITS) ×3 IMPLANT
LABEL OR SOLS (LABEL) IMPLANT
LEAD CAPSURE NOVUS 5076-52CM (Lead) ×3 IMPLANT
LEAD CAPSURE NOVUS 5076-58CM (Lead) ×3 IMPLANT
MARKER SKIN DUAL TIP RULER LAB (MISCELLANEOUS) ×3 IMPLANT
PACK PACE INSERTION (MISCELLANEOUS) ×3 IMPLANT
PAD STATPAD (MISCELLANEOUS) ×3 IMPLANT
PLASMABLADE 3.0S (MISCELLANEOUS) ×3
PPM ADVISA MRI DR A2DR01 (Pacemaker) ×3 IMPLANT
SUT SILK 0 SH 30 (SUTURE) ×9 IMPLANT

## 2016-05-24 NOTE — Progress Notes (Signed)
Chaplain rounded the unit to complete the Spiritual Consult to "Create or Update Advance Directive"  Chaplain reviewed the information with the patient, wife and son. Advance Directive information was given and left for their private review and timely completion. Patient and family were instructed to contact the nurse who will in turn contact a Chaplain for total completion. Minerva Fester [phone]

## 2016-05-24 NOTE — Addendum Note (Signed)
Addendum  created 05/24/16 1458 by Nelda Marseille, CRNA   Modules edited: Anesthesia Medication Administration

## 2016-05-24 NOTE — Progress Notes (Signed)
Pt. admitted to unit, rm246 from PACU, s/p PPM placement. Oriented to room, call bell, Ascom phones and staff. Bed in low position. Fall safety plan reviewed,non-skid socks in place, bed alarm on. Full assessment to Epic; skin assessed with Carlyle Dolly, RN. PPM site is CDI, no evidence of drainage. Patient reports no pain. PPM card and booklet given to wife. Telemetry box verified with CCMD and Angelia Mould, NT: box#[id number].Will continue to monitor.

## 2016-05-24 NOTE — Anesthesia Procedure Notes (Signed)
Date/Time: 05/24/2016 12:20 PM Performed by: Nelda Marseille Pre-anesthesia Checklist: Patient identified, Emergency Drugs available, Suction available, Patient being monitored and Timeout performed Oxygen Delivery Method: Simple face mask

## 2016-05-24 NOTE — Anesthesia Postprocedure Evaluation (Signed)
Anesthesia Post Note  Patient: Lance Diaz  Procedure(s) Performed: Procedure(s) (LRB): INSERTION PACEMAKER (Left)  Patient location during evaluation: PACU Anesthesia Type: MAC Level of consciousness: awake Pain management: pain level controlled Vital Signs Assessment: post-procedure vital signs reviewed and stable Respiratory status: respiratory function stable Cardiovascular status: stable Anesthetic complications: no    Last Vitals:  Filed Vitals:   05/24/16 1057 05/24/16 1328  BP: 146/113 138/80  Pulse: 60 62  Temp: 36.4 C 36.1 C  Resp: 16 17    Last Pain:  Filed Vitals:   05/24/16 1334  PainSc: 0-No pain                 VAN STAVEREN,Angalena Cousineau

## 2016-05-24 NOTE — Op Note (Signed)
[facility] Cardiology   05/24/2016                     1:26 PM  PATIENT:  Lance Diaz    PRE-OPERATIVE DIAGNOSIS:  CHRONOTROPIC INCOMPETENCE  POST-OPERATIVE DIAGNOSIS:  Same  PROCEDURE:  INSERTION PACEMAKER  SURGEON:  Isaias Cowman, MD    ANESTHESIA:     PREOPERATIVE INDICATIONS:  Lance Diaz is a  82 y.o. male with a diagnosis of CHRONOTROPIC INCOMPETENCE who failed conservative measures and elected for surgical management.    The risks benefits and alternatives were discussed with the patient preoperatively including but not limited to the risks of infection, bleeding, cardiopulmonary complications, the need for revision surgery, among others, and the patient was willing to proceed.   OPERATIVE PROCEDURE: The patient was brought to the operating room the fasting state. The left pectoral region was prepped and draped in the usual sterile manner. A 6 cm incision was performed a left pectoral region. The pacemaker pocket was generated by cautery and blunt dissection. Access was obtained the left subclavian vein by fine needle aspiration. MRI compatible ventricular and atrial leads were positioned to the right ventricular apical septum and right atrial appendage on the fluoroscopic guidance. After proper thresholds were obtained the leads were sutured in place. The leads were connected to a MRI compatible dual chamber rate responsive pacemaker (Medtronic A2 DR1). The pacemaker pocket was irrigated with gentamicin solution. The pacemaker generator was positioned in the pocket and the pocket was closed with 2-0 and 4-0 Vicryl, respectively. Steri-Strips and a pressure dressing were applied.

## 2016-05-24 NOTE — Transfer of Care (Signed)
Immediate Anesthesia Transfer of Care Note  Patient: Lance Diaz  Procedure(s) Performed: Procedure(s): INSERTION PACEMAKER (Left)  Patient Location: PACU  Anesthesia Type:General  Level of Consciousness: awake and sedated  Airway & Oxygen Therapy: Patient Spontanous Breathing and Patient connected to face mask oxygen  Post-op Assessment: Report given to RN and Post -op Vital signs reviewed and stable  Post vital signs: Reviewed and stable  Last Vitals:  Filed Vitals:   05/24/16 1057  BP: 146/113  Pulse: 60  Temp: 36.4 C  Resp: 16    Last Pain:  Filed Vitals:   05/24/16 1100  PainSc: 3          Complications: No apparent anesthesia complications

## 2016-05-24 NOTE — Anesthesia Preprocedure Evaluation (Signed)
Anesthesia Evaluation  Patient identified by MRN, date of birth, ID band Patient awake    Reviewed: Allergy & Precautions, NPO status , Patient's Chart, lab work & pertinent test results  Airway Mallampati: III       Dental  (+) Upper Dentures, Lower Dentures   Pulmonary former smoker,    breath sounds clear to auscultation       Cardiovascular Exercise Tolerance: Good hypertension, + Peripheral Vascular Disease  + dysrhythmias  Rhythm:Regular Rate:Abnormal     Neuro/Psych    GI/Hepatic Neg liver ROS, GERD  Medicated,  Endo/Other  diabetes, Type 2, Oral Hypoglycemic Agents  Renal/GU negative Renal ROS     Musculoskeletal   Abdominal   Peds  Hematology negative hematology ROS (+)   Anesthesia Other Findings   Reproductive/Obstetrics                             Anesthesia Physical Anesthesia Plan  ASA: III  Anesthesia Plan: MAC   Post-op Pain Management:    Induction: Intravenous  Airway Management Planned: Natural Airway and Nasal Cannula  Additional Equipment:   Intra-op Plan:   Post-operative Plan:   Informed Consent: I have reviewed the patients History and Physical, chart, labs and discussed the procedure including the risks, benefits and alternatives for the proposed anesthesia with the patient or authorized representative who has indicated his/her understanding and acceptance.     Plan Discussed with: CRNA  Anesthesia Plan Comments:         Anesthesia Quick Evaluation

## 2016-05-25 ENCOUNTER — Other Ambulatory Visit: Payer: Self-pay

## 2016-05-25 DIAGNOSIS — I495 Sick sinus syndrome: Secondary | ICD-10-CM | POA: Diagnosis not present

## 2016-05-25 MED ORDER — CEPHALEXIN 250 MG PO CAPS: 250.0000 mg | ORAL_CAPSULE | Freq: Four times a day (QID) | ORAL | Status: DC

## 2016-05-25 NOTE — Progress Notes (Signed)
Pt. Discharged to home via wc. Discharge instructions and medication regimen reviewed at bedside with patient, spouse and family. Both verbalize understanding of instructions and medication regimen. Prescriptions given to pt to take to pharmacy. Patient assessment unchanged from this morning. TELE and IV discontinued per policy.

## 2016-05-25 NOTE — Discharge Instructions (Signed)
Do not lift arm above head.

## 2016-06-19 DIAGNOSIS — I480 Paroxysmal atrial fibrillation: Secondary | ICD-10-CM | POA: Insufficient documentation

## 2016-07-04 DIAGNOSIS — R001 Bradycardia, unspecified: Secondary | ICD-10-CM | POA: Insufficient documentation

## 2016-10-30 DIAGNOSIS — E538 Deficiency of other specified B group vitamins: Secondary | ICD-10-CM | POA: Insufficient documentation

## 2016-11-09 DIAGNOSIS — I7300 Raynaud's syndrome without gangrene: Secondary | ICD-10-CM | POA: Insufficient documentation

## 2017-04-17 ENCOUNTER — Ambulatory Visit (INDEPENDENT_AMBULATORY_CARE_PROVIDER_SITE_OTHER): Payer: Medicare Other | Admitting: Surgery

## 2017-04-17 ENCOUNTER — Encounter: Payer: Self-pay | Admitting: Surgery

## 2017-04-17 ENCOUNTER — Telehealth: Payer: Self-pay

## 2017-04-17 VITALS — BP 127/69 | HR 67 | Temp 98.0°F | Ht 74.0 in | Wt 177.0 lb

## 2017-04-17 DIAGNOSIS — K4091 Unilateral inguinal hernia, without obstruction or gangrene, recurrent: Secondary | ICD-10-CM

## 2017-04-17 NOTE — Telephone Encounter (Signed)
[facility] Card[facility] to schedule with Dr. Serafina Royals. For medical clearance for Dr. Dahlia Byes to do an inguinal hernia repair for patient.  Appointment is schedule for May 23rd at 3:45 PM.

## 2017-04-17 NOTE — Patient Instructions (Signed)
We have scheduled you a medical clearance with your cardiologist Dr. [name] on Wednesday, May 23rd at 3:45 PM.   We have also scheduled your Hernia Repair surgery for Monday May 28th with Dr. Dahlia Byes.  Please see your blue sheet for further instructions.

## 2017-04-17 NOTE — Progress Notes (Signed)
Patient ID: Lance Diaz, male   DOB: 08/08/1931, 83 y.o.   MRN: [id number]  Lance Diaz is a 83 y.o. male comes with gradual onset of right inguinal pain. Patient reports the pain is intermittent, moderate in intensity and sharp in nature. Pain is worsening with movement and heavy lifting. Pain gets better when he he rests. Of note he did have a robotically assisted laparoscopic inguinal hernia repair with mesh more than 3 years ago by Dr. Marina Gravel. Male reports that his symptoms started a few weeks ago. Of note he has a significant history of atrial fibrillation, he does have a pacemaker and is on anticoagulation and Cardizem. He cc cardiologist periodically and has an appointment to in next few weeks. He reports no previous heart attacks no previous stents. He does report some dyspnea on exertion. This has not been interrogated by his cardiologist and has been present for last month or so.  Lance  Past Medical History:  Diagnosis Date  . Anemia   . Chronic kidney disease   . Diabetes mellitus without complication ([place])   . Diverticulosis   . Erectile dysfunction   . GERD (gastroesophageal reflux disease)   . High cholesterol   . Peripheral vascular disease [facility])     Past Surgical History:  Procedure Laterality Date  . cirmucision    . HERNIA REPAIR Right 2014  . PACEMAKER INSERTION Left 05/24/2016   Procedure: INSERTION PACEMAKER;  Surgeon: Isaias Cowman, MD;  Location: [facility] ORS;  Service: Cardiovascular;  Laterality: Left;    Family History  Problem Relation Age of Onset  . Cancer Daughter   . Cancer Father        Prostate Cancer  . Renal Disease Mother   . Coronary artery disease Sister     Social History Social History  Substance Use Topics  . Smoking status: Former Smoker    Quit date: 05/16/1986  . Smokeless tobacco: Never Used  . Alcohol use No    Allergies  Allergen Reactions  . Ace Inhibitors Other (See Comments)    Headaches  . Iodine Swelling     Current Outpatient Prescriptions  Medication Sig Dispense Refill  . amLODipine (NORVASC) 2.5 MG tablet Take 2.5 mg by mouth daily.    . clindamycin (CLEOCIN T) 1 % external solution Apply 1 application topically 2 (two) times daily as needed.    . diltiazem (TIAZAC) 120 MG 24 hr capsule Take 120 mg by mouth once.    . Iron-Vitamin C (VITRON-C) 65-125 MG TABS Take 1 tablet by mouth once.    . metFORMIN (GLUCOPHAGE) 500 MG tablet Take 500 mg by mouth 2 (two) times daily.    . pantoprazole (PROTONIX) 40 MG tablet Take 40 mg by mouth daily.    . valACYclovir (VALTREX) 1000 MG tablet Take 2 tablets by mouth 2 (two) times daily.    . vardenafil (LEVITRA) 20 MG tablet Take 20 mg by mouth as needed.     No current facility-administered medications for this visit.      Review of Systems Full ROS  was asked and was negative except for the information on the Lance  Physical Exam Blood pressure 127/69, pulse 67, temperature 98 F (36.7 C), temperature source Oral, height 6\' 2"  (1.88 m), weight 80.3 kg (177 lb). CONSTITUTIONAL: NAD EYES: Pupils are equal, round, and reactive to light, Sclera are non-icteric. EARS, NOSE, MOUTH AND THROAT: The oropharynx is clear. The oral mucosa is pink and moist. Hearing is intact  to voice. LYMPH NODES:  Lymph nodes in the neck are normal. RESPIRATORY:  Lungs are clear. There is normal respiratory effort, with equal breath sounds bilaterally, and without pathologic use of accessory muscles. CARDIOVASCULAR: Heart is regular without murmurs, gallops, or rubs. There is a pacemaker on the upper left chest wall GI: The abdomen is  Soft, there is a reducible right inguinal hernia that is tender to palpation. No evidence of strangulation or incarceration. GU: Testicles without any masses. No penile or scrotal lesions MUSCULOSKELETAL: Normal muscle strength and tone. No cyanosis or edema.   SKIN: Turgor is good and there are no pathologic skin lesions or  ulcers. NEUROLOGIC: Motor and sensation is grossly normal. Cranial nerves are grossly intact. PSYCH:  Oriented to person, place and time. Affect is normal.  Data Reviewed  I have personally reviewed the patient's imaging, laboratory findings and medical records.    Assessment/Plan   recurrent right inguinal hernia on an outpatient area in male with multiple comorbidities including A. fib, symptomatic bradycardia status post pacemaker and on anticoagulation. He now has a recent onset of dyspnea and is to be interrogated by his cardiologist. From a surgical standpoint he will need an open inguinal hernia repair with mesh but all diffusely his heart this to be interrogated first and we need to have preoperative optimization by cardiology. Given that his chads score is 2 and he has not had any history of stroke or embolic events and a think it safe to stop the Eliquis 48 hrs in advance. We will obviously consult with cardiology before this decision is made. Once we have clearance from cardiology week in scheduling him from an open inguinal hernia repair in the outpatient setting.I have explained the procedure, risks, and aftercare of inguinal hernia repair to [name].   Risks include but are not limited to bleeding, infection, wound problems, anesthesia, recurrence, bladder or intestine injury, urinary retention, testicular dysfunction, chronic pain, mesh problems.  He  seems to understand and agrees to proceed.  Questions were answered to his stated satisfaction.  Caroleen Hamman, MD FACS General Surgeon 04/17/2017, 10:03 AM

## 2017-04-18 ENCOUNTER — Telehealth: Payer: Self-pay

## 2017-04-18 ENCOUNTER — Telehealth: Payer: Self-pay | Admitting: Surgery

## 2017-04-18 NOTE — Telephone Encounter (Signed)
Pt advised of pre op date/time and sx date. Sx: 05/01/17 with Dr Pabon--Open Right Inguinal Hernia Repair. Pre op: 04/20/17 (office) 11:15am.  Patient made aware to call [phone], between 1-3:00pm the day before surgery, to find out what time to arrive.

## 2017-04-18 NOTE — Telephone Encounter (Signed)
Anti-coagulant / Cardiac Clearance faxed to Dr.Bruce Kowalski at this time.

## 2017-04-20 ENCOUNTER — Encounter
Admission: RE | Admit: 2017-04-20 | Discharge: 2017-04-20 | Disposition: A | Discharge status: Home / Self Care | Payer: Medicare Other | Source: Ambulatory Visit | Attending: Surgery | Admitting: Surgery

## 2017-04-20 DIAGNOSIS — E119 Type 2 diabetes mellitus without complications: Secondary | ICD-10-CM | POA: Diagnosis not present

## 2017-04-20 DIAGNOSIS — Z01810 Encounter for preprocedural cardiovascular examination: Secondary | ICD-10-CM | POA: Insufficient documentation

## 2017-04-20 DIAGNOSIS — R001 Bradycardia, unspecified: Secondary | ICD-10-CM | POA: Diagnosis not present

## 2017-04-20 DIAGNOSIS — Z01812 Encounter for preprocedural laboratory examination: Secondary | ICD-10-CM | POA: Diagnosis not present

## 2017-04-20 HISTORY — DX: Dyspnea, unspecified: R06.00

## 2017-04-20 HISTORY — DX: Presence of cardiac pacemaker: Z95.0

## 2017-04-20 LAB — CBC
HCT: 35.8 % — ABNORMAL LOW (ref 40.0–52.0)
Hemoglobin: 12.2 g/dL — ABNORMAL LOW (ref 13.0–18.0)
MCH: 33 pg (ref 26.0–34.0)
MCHC: 34.1 g/dL (ref 32.0–36.0)
MCV: 96.8 fL (ref 80.0–100.0)
PLATELETS: 248 10*3/uL (ref 150–440)
RBC: 3.69 MIL/uL — AB (ref 4.40–5.90)
RDW: 13.5 % (ref 11.5–14.5)
WBC: 5.3 10*3/uL (ref 3.8–10.6)

## 2017-04-20 LAB — BASIC METABOLIC PANEL
ANION GAP: 5 (ref 5–15)
BUN: 16 mg/dL (ref 6–20)
CO2: 28 mmol/L (ref 22–32)
Calcium: 9.1 mg/dL (ref 8.9–10.3)
Chloride: 107 mmol/L (ref 101–111)
Creatinine, Ser: 1.46 mg/dL — ABNORMAL HIGH (ref 0.61–1.24)
GFR calc Af Amer: 49 mL/min — ABNORMAL LOW (ref >60)
GFR, EST NON AFRICAN AMERICAN: 42 mL/min — AB (ref >60)
GLUCOSE: 114 mg/dL — AB (ref 65–99)
Potassium: 3.6 mmol/L (ref 3.5–5.1)
SODIUM: 140 mmol/L (ref 135–145)

## 2017-04-20 NOTE — Pre-Procedure Instructions (Signed)
Spoke with Dr. Randa Lynn regarding Anesthesia consult along with EKG today's results.  Pt to see cardiology next week in anticipation of upcoming surgery.  Dr. Randa Lynn is OK with pt seeing cardiologist, nothing further needs to be done from an anesthesia standpoint, proceed.

## 2017-04-20 NOTE — Pre-Procedure Instructions (Signed)
Faxed Perioperative prescription for implanted cardiac device programming form to Dr. Alveria Apley office.

## 2017-04-20 NOTE — Patient Instructions (Signed)
  Your procedure is scheduled [id number] May 29 , 2018. Report to Same Day Surgery. To find out your arrival time please call [phone] between 1PM - 3PM on Friday Apr 27, 2017.  Remember: Instructions that are not followed completely may result in serious medical risk, up to and including death, or upon the discretion of your surgeon and anesthesiologist your surgery may need to be rescheduled.    _x___ 1. Do not eat food or drink liquids after midnight. No gum chewing or hard candies.     ____ 2. No Alcohol for 24 hours before or after surgery.   ____ 3. Bring all medications with you on the day of surgery if instructed.    __x__ 4. Notify your doctor if there is any change in your medical condition     (cold, fever, infections).    _____ 5. No smoking 24 hours prior to surgery.     Do not wear jewelry, make-up, hairpins, clips or nail polish.  Do not wear lotions, powders, or perfumes.   Do not shave 48 hours prior to surgery. Men may shave face and neck.  Do not bring valuables to the hospital.    [facility] is not responsible for any belongings or valuables.               Contacts, dentures or bridgework may not be worn into surgery.  Leave your suitcase in the car. After surgery it may be brought to your room.  For patients admitted to the hospital, discharge time is determined by your treatment team.   Patients discharged the day of surgery will not be allowed to drive home.    Please read over the following fact sheets that you were given:   [facility] Preparing for Surgery  _x___ Take these medicines the morning of surgery with A SIP OF WATER:    1. pantoprazole (PROTONIX)   ____ Fleet Enema (as directed)   _x___ Use CHG Soap as directed on instruction sheet  ____ Use inhalers on the day of surgery and bring to hospital day of surgery  _x___ Stop metformin 2 days prior to surgery on Sunday Apr 29, 2017.    ____ Take 1/2 of usual insulin dose the night  before surgery and none on the morning of surgery.   __x__ Stop Eliquis as directed by surgeon and cardiologist.  _x___ Stop Anti-inflammatories such as Advil, Aleve, Ibuprofen, Motrin, Naproxen, Naprosyn, Goodies powders or aspirin products. OK to take Tylenol.   ____ Stop supplements until after surgery.    ____ Bring C-Pap to the hospital.

## 2017-04-26 ENCOUNTER — Telehealth: Payer: Self-pay

## 2017-04-26 NOTE — Pre-Procedure Instructions (Signed)
CLEARED BY DR [name]  04/25/17

## 2017-04-26 NOTE — Telephone Encounter (Signed)
Cardiac Clearance and Anti-coagulant clearance obtained at this time from [facility] at this time and will be scanned under Media.

## 2017-04-30 MED ORDER — CEFAZOLIN SODIUM-DEXTROSE 2-4 GM/100ML-% IV SOLN
2.0000 g | INTRAVENOUS | Status: AC
  Administered 2017-05-01: 2 g via INTRAVENOUS

## 2017-05-01 ENCOUNTER — Ambulatory Visit: Payer: Medicare Other | Admitting: Certified Registered"

## 2017-05-01 ENCOUNTER — Encounter
Admission: RE | Disposition: A | Discharge status: Home / Self Care | Payer: Self-pay | Source: Ambulatory Visit | Attending: Surgery

## 2017-05-01 ENCOUNTER — Ambulatory Visit
Admission: RE | Admit: 2017-05-01 | Discharge: 2017-05-01 | Disposition: A | Discharge status: Home / Self Care | Payer: Medicare Other | Source: Ambulatory Visit | Attending: Surgery | Admitting: Surgery

## 2017-05-01 DIAGNOSIS — E1151 Type 2 diabetes mellitus with diabetic peripheral angiopathy without gangrene: Secondary | ICD-10-CM | POA: Diagnosis not present

## 2017-05-01 DIAGNOSIS — I4891 Unspecified atrial fibrillation: Secondary | ICD-10-CM | POA: Diagnosis not present

## 2017-05-01 DIAGNOSIS — Z87891 Personal history of nicotine dependence: Secondary | ICD-10-CM | POA: Diagnosis not present

## 2017-05-01 DIAGNOSIS — Z8249 Family history of ischemic heart disease and other diseases of the circulatory system: Secondary | ICD-10-CM | POA: Insufficient documentation

## 2017-05-01 DIAGNOSIS — N529 Male erectile dysfunction, unspecified: Secondary | ICD-10-CM | POA: Diagnosis not present

## 2017-05-01 DIAGNOSIS — I129 Hypertensive chronic kidney disease with stage 1 through stage 4 chronic kidney disease, or unspecified chronic kidney disease: Secondary | ICD-10-CM | POA: Insufficient documentation

## 2017-05-01 DIAGNOSIS — E1122 Type 2 diabetes mellitus with diabetic chronic kidney disease: Secondary | ICD-10-CM | POA: Diagnosis not present

## 2017-05-01 DIAGNOSIS — Z8719 Personal history of other diseases of the digestive system: Secondary | ICD-10-CM | POA: Insufficient documentation

## 2017-05-01 DIAGNOSIS — Z91041 Radiographic dye allergy status: Secondary | ICD-10-CM | POA: Diagnosis not present

## 2017-05-01 DIAGNOSIS — Z7901 Long term (current) use of anticoagulants: Secondary | ICD-10-CM | POA: Insufficient documentation

## 2017-05-01 DIAGNOSIS — K4091 Unilateral inguinal hernia, without obstruction or gangrene, recurrent: Secondary | ICD-10-CM | POA: Diagnosis present

## 2017-05-01 DIAGNOSIS — Z950 Presence of cardiac pacemaker: Secondary | ICD-10-CM | POA: Insufficient documentation

## 2017-05-01 DIAGNOSIS — Z8042 Family history of malignant neoplasm of prostate: Secondary | ICD-10-CM | POA: Diagnosis not present

## 2017-05-01 DIAGNOSIS — N189 Chronic kidney disease, unspecified: Secondary | ICD-10-CM | POA: Insufficient documentation

## 2017-05-01 DIAGNOSIS — Z841 Family history of disorders of kidney and ureter: Secondary | ICD-10-CM | POA: Insufficient documentation

## 2017-05-01 DIAGNOSIS — K219 Gastro-esophageal reflux disease without esophagitis: Secondary | ICD-10-CM | POA: Insufficient documentation

## 2017-05-01 DIAGNOSIS — Z7984 Long term (current) use of oral hypoglycemic drugs: Secondary | ICD-10-CM | POA: Insufficient documentation

## 2017-05-01 DIAGNOSIS — E7800 Pure hypercholesterolemia, unspecified: Secondary | ICD-10-CM | POA: Diagnosis not present

## 2017-05-01 DIAGNOSIS — Z888 Allergy status to other drugs, medicaments and biological substances status: Secondary | ICD-10-CM | POA: Diagnosis not present

## 2017-05-01 DIAGNOSIS — Z79899 Other long term (current) drug therapy: Secondary | ICD-10-CM | POA: Diagnosis not present

## 2017-05-01 HISTORY — PX: INGUINAL HERNIA REPAIR: SHX194

## 2017-05-01 HISTORY — PX: INSERTION OF MESH: SHX5868

## 2017-05-01 LAB — GLUCOSE, CAPILLARY
GLUCOSE-CAPILLARY: 101 mg/dL — AB (ref 65–99)
Glucose-Capillary: 93 mg/dL (ref 65–99)

## 2017-05-01 SURGERY — REPAIR, HERNIA, INGUINAL, ADULT
Anesthesia: General | Laterality: Right

## 2017-05-01 MED ORDER — FENTANYL CITRATE (PF) 100 MCG/2ML IJ SOLN: 25.0000 ug | INTRAMUSCULAR | Status: DC | PRN

## 2017-05-01 MED ORDER — HYDROCODONE-ACETAMINOPHEN 5-325 MG PO TABS
ORAL_TABLET | ORAL | Status: AC
  Administered 2017-05-01: 1 via ORAL
  Filled 2017-05-01: qty 1

## 2017-05-01 MED ORDER — BUPIVACAINE-EPINEPHRINE (PF) 0.25% -1:200000 IJ SOLN
INTRAMUSCULAR | Status: DC | PRN
  Administered 2017-05-01: 30 mL

## 2017-05-01 MED ORDER — FENTANYL CITRATE (PF) 100 MCG/2ML IJ SOLN
INTRAMUSCULAR | Status: DC | PRN
  Administered 2017-05-01: 50 ug via INTRAVENOUS
  Administered 2017-05-01: 100 ug via INTRAVENOUS

## 2017-05-01 MED ORDER — ROCURONIUM BROMIDE 100 MG/10ML IV SOLN
INTRAVENOUS | Status: DC | PRN
  Administered 2017-05-01: 5 mg via INTRAVENOUS
  Administered 2017-05-01: 30 mg via INTRAVENOUS

## 2017-05-01 MED ORDER — HYDROCODONE-ACETAMINOPHEN 5-325 MG PO TABS: 1.0000 | ORAL_TABLET | Freq: Four times a day (QID) | ORAL | 0 refills | Status: DC | PRN

## 2017-05-01 MED ORDER — HYDROCODONE-ACETAMINOPHEN 5-325 MG PO TABS
1.0000 | ORAL_TABLET | Freq: Four times a day (QID) | ORAL | Status: DC | PRN
  Administered 2017-05-01: 1 via ORAL

## 2017-05-01 MED ORDER — PROPOFOL 10 MG/ML IV BOLUS
INTRAVENOUS | Status: DC | PRN
  Administered 2017-05-01: 130 mg via INTRAVENOUS

## 2017-05-01 MED ORDER — CEFAZOLIN SODIUM-DEXTROSE 2-4 GM/100ML-% IV SOLN
INTRAVENOUS | Status: AC
  Filled 2017-05-01: qty 100

## 2017-05-01 MED ORDER — ACETAMINOPHEN 10 MG/ML IV SOLN
INTRAVENOUS | Status: DC | PRN
  Administered 2017-05-01: 1000 mg via INTRAVENOUS

## 2017-05-01 MED ORDER — SUGAMMADEX SODIUM 200 MG/2ML IV SOLN
INTRAVENOUS | Status: DC | PRN
  Administered 2017-05-01: 170 mg via INTRAVENOUS

## 2017-05-01 MED ORDER — CHLORHEXIDINE GLUCONATE CLOTH 2 % EX PADS: 6.0000 | MEDICATED_PAD | Freq: Once | CUTANEOUS | Status: DC

## 2017-05-01 MED ORDER — SODIUM CHLORIDE 0.9 % IV SOLN
INTRAVENOUS | Status: DC
  Administered 2017-05-01: 11:00:00 via INTRAVENOUS

## 2017-05-01 MED ORDER — FENTANYL CITRATE (PF) 100 MCG/2ML IJ SOLN
INTRAMUSCULAR | Status: AC
  Filled 2017-05-01: qty 2

## 2017-05-01 MED ORDER — BUPIVACAINE LIPOSOME 1.3 % IJ SUSP
INTRAMUSCULAR | Status: AC
  Filled 2017-05-01: qty 20

## 2017-05-01 MED ORDER — PROPOFOL 10 MG/ML IV BOLUS
INTRAVENOUS | Status: AC
  Filled 2017-05-01: qty 20

## 2017-05-01 MED ORDER — BUPIVACAINE LIPOSOME 1.3 % IJ SUSP
INTRAMUSCULAR | Status: DC | PRN
  Administered 2017-05-01: 20 mL

## 2017-05-01 MED ORDER — SUCCINYLCHOLINE CHLORIDE 20 MG/ML IJ SOLN
INTRAMUSCULAR | Status: DC | PRN
  Administered 2017-05-01: 90 mg via INTRAVENOUS

## 2017-05-01 MED ORDER — SUGAMMADEX SODIUM 200 MG/2ML IV SOLN
INTRAVENOUS | Status: AC
  Filled 2017-05-01: qty 2

## 2017-05-01 MED ORDER — ACETAMINOPHEN NICU IV SYRINGE 10 MG/ML
INTRAVENOUS | Status: AC
  Filled 2017-05-01: qty 1

## 2017-05-01 MED ORDER — SEVOFLURANE IN SOLN
RESPIRATORY_TRACT | Status: AC
  Filled 2017-05-01: qty 250

## 2017-05-01 MED ORDER — ONDANSETRON HCL 4 MG/2ML IJ SOLN
INTRAMUSCULAR | Status: DC | PRN
  Administered 2017-05-01: 4 mg via INTRAVENOUS

## 2017-05-01 MED ORDER — ROCURONIUM BROMIDE 50 MG/5ML IV SOLN
INTRAVENOUS | Status: AC
  Filled 2017-05-01: qty 1

## 2017-05-01 MED ORDER — ONDANSETRON HCL 4 MG/2ML IJ SOLN: 4.0000 mg | Freq: Once | INTRAMUSCULAR | Status: DC | PRN

## 2017-05-01 MED ORDER — BUPIVACAINE-EPINEPHRINE (PF) 0.25% -1:200000 IJ SOLN
INTRAMUSCULAR | Status: AC
  Filled 2017-05-01: qty 30

## 2017-05-01 MED ORDER — ONDANSETRON HCL 4 MG/2ML IJ SOLN
INTRAMUSCULAR | Status: AC
  Filled 2017-05-01: qty 2

## 2017-05-01 SURGICAL SUPPLY — 30 items
BLADE CLIPPER SURG (BLADE) IMPLANT
CANISTER SUCT 1200ML W/VALVE (MISCELLANEOUS) ×4 IMPLANT
CHLORAPREP W/TINT 26ML (MISCELLANEOUS) ×4 IMPLANT
DERMABOND ADVANCED (GAUZE/BANDAGES/DRESSINGS) ×2
DERMABOND ADVANCED .7 DNX12 (GAUZE/BANDAGES/DRESSINGS) ×2 IMPLANT
DRAIN PENROSE 5/8X18 LTX STRL (WOUND CARE) ×4 IMPLANT
DRAPE INCISE IOBAN 66X45 STRL (DRAPES) ×4 IMPLANT
DRAPE PED LAPAROTOMY (DRAPES) ×4 IMPLANT
ELECT REM PT RETURN 9FT ADLT (ELECTROSURGICAL) ×4
ELECTRODE REM PT RTRN 9FT ADLT (ELECTROSURGICAL) ×2 IMPLANT
GLOVE BIO SURGEON STRL SZ7 (GLOVE) ×16 IMPLANT
GOWN STRL REUS W/ TWL LRG LVL3 (GOWN DISPOSABLE) ×4 IMPLANT
GOWN STRL REUS W/TWL LRG LVL3 (GOWN DISPOSABLE) ×4
MESH HERNIA 6X12 ULTRAPRO MED (Mesh General) ×2 IMPLANT
MESH HERNIA ULTRAPRO MED (Mesh General) ×2 IMPLANT
NEEDLE HYPO 22GX1.5 SAFETY (NEEDLE) ×4 IMPLANT
NS IRRIG 1000ML POUR BTL (IV SOLUTION) ×4 IMPLANT
PACK BASIN MINOR ARMC (MISCELLANEOUS) ×4 IMPLANT
SPONGE KITTNER 5P (MISCELLANEOUS) ×4 IMPLANT
SPONGE LAP 18X18 5 PK (GAUZE/BANDAGES/DRESSINGS) ×4 IMPLANT
SUT ETHIBOND 0 MO6 C/R (SUTURE) ×8 IMPLANT
SUT ETHIBOND NAB MO 7 #0 18IN (SUTURE) IMPLANT
SUT MNCRL AB 4-0 PS2 18 (SUTURE) IMPLANT
SUT VIC AB 2-0 SH 27 (SUTURE) ×2
SUT VIC AB 2-0 SH 27XBRD (SUTURE) ×2 IMPLANT
SUT VIC AB 3-0 54X BRD REEL (SUTURE) IMPLANT
SUT VIC AB 3-0 BRD 54 (SUTURE)
SUT VIC AB 3-0 SH 27 (SUTURE)
SUT VIC AB 3-0 SH 27X BRD (SUTURE) IMPLANT
SYR 20CC LL (SYRINGE) ×4 IMPLANT

## 2017-05-01 NOTE — H&P (View-Only) (Signed)
Patient ID: Lance Diaz, male   DOB: December 26, 1930, 83 y.o.   MRN: [id number]  HPI Lance Diaz is a 83 y.o. male comes with gradual onset of right inguinal pain. Patient reports the pain is intermittent, moderate in intensity and sharp in nature. Pain is worsening with movement and heavy lifting. Pain gets better when he he rests. Of note he did have a robotically assisted laparoscopic inguinal hernia repair with mesh more than 3 years ago by Dr. Marina Gravel. Male reports that his symptoms started a few weeks ago. Of note he has a significant history of atrial fibrillation, he does have a pacemaker and is on anticoagulation and Cardizem. He cc cardiologist periodically and has an appointment to in next few weeks. He reports no previous heart attacks no previous stents. He does report some dyspnea on exertion. This has not been interrogated by his cardiologist and has been present for last month or so.  HPI  Past Medical History:  Diagnosis Date  . Anemia   . Chronic kidney disease   . Diabetes mellitus without complication ([place])   . Diverticulosis   . Erectile dysfunction   . GERD (gastroesophageal reflux disease)   . High cholesterol   . Peripheral vascular disease [facility])     Past Surgical History:  Procedure Laterality Date  . cirmucision    . HERNIA REPAIR Right 2014  . PACEMAKER INSERTION Left 05/24/2016   Procedure: INSERTION PACEMAKER;  Surgeon: Isaias Cowman, MD;  Location: [facility] ORS;  Service: Cardiovascular;  Laterality: Left;    Family History  Problem Relation Age of Onset  . Cancer Daughter   . Cancer Father        Prostate Cancer  . Renal Disease Mother   . Coronary artery disease Sister     Social History Social History  Substance Use Topics  . Smoking status: Former Smoker    Quit date: 05/16/1986  . Smokeless tobacco: Never Used  . Alcohol use No    Allergies  Allergen Reactions  . Ace Inhibitors Other (See Comments)    Headaches  . Iodine Swelling     Current Outpatient Prescriptions  Medication Sig Dispense Refill  . amLODipine (NORVASC) 2.5 MG tablet Take 2.5 mg by mouth daily.    . clindamycin (CLEOCIN T) 1 % external solution Apply 1 application topically 2 (two) times daily as needed.    . diltiazem (TIAZAC) 120 MG 24 hr capsule Take 120 mg by mouth once.    . Iron-Vitamin C (VITRON-C) 65-125 MG TABS Take 1 tablet by mouth once.    . metFORMIN (GLUCOPHAGE) 500 MG tablet Take 500 mg by mouth 2 (two) times daily.    . pantoprazole (PROTONIX) 40 MG tablet Take 40 mg by mouth daily.    . valACYclovir (VALTREX) 1000 MG tablet Take 2 tablets by mouth 2 (two) times daily.    . vardenafil (LEVITRA) 20 MG tablet Take 20 mg by mouth as needed.     No current facility-administered medications for this visit.      Review of Systems Full ROS  was asked and was negative except for the information on the HPI  Physical Exam Blood pressure 127/69, pulse 67, temperature 98 F (36.7 C), temperature source Oral, height 6\' 2"  (1.88 m), weight 80.3 kg (177 lb). CONSTITUTIONAL: NAD EYES: Pupils are equal, round, and reactive to light, Sclera are non-icteric. EARS, NOSE, MOUTH AND THROAT: The oropharynx is clear. The oral mucosa is pink and moist. Hearing is intact  to voice. LYMPH NODES:  Lymph nodes in the neck are normal. RESPIRATORY:  Lungs are clear. There is normal respiratory effort, with equal breath sounds bilaterally, and without pathologic use of accessory muscles. CARDIOVASCULAR: Heart is regular without murmurs, gallops, or rubs. There is a pacemaker on the upper left chest wall GI: The abdomen is  Soft, there is a reducible right inguinal hernia that is tender to palpation. No evidence of strangulation or incarceration. GU: Testicles without any masses. No penile or scrotal lesions MUSCULOSKELETAL: Normal muscle strength and tone. No cyanosis or edema.   SKIN: Turgor is good and there are no pathologic skin lesions or  ulcers. NEUROLOGIC: Motor and sensation is grossly normal. Cranial nerves are grossly intact. PSYCH:  Oriented to person, place and time. Affect is normal.  Data Reviewed  I have personally reviewed the patient's imaging, laboratory findings and medical records.    Assessment/Plan   recurrent right inguinal hernia on an outpatient area in male with multiple comorbidities including A. fib, symptomatic bradycardia status post pacemaker and on anticoagulation. He now has a recent onset of dyspnea and is to be interrogated by his cardiologist. From a surgical standpoint he will need an open inguinal hernia repair with mesh but all diffusely his heart this to be interrogated first and we need to have preoperative optimization by cardiology. Given that his chads score is 2 and he has not had any history of stroke or embolic events and a think it safe to stop the Eliquis 48 hrs in advance. We will obviously consult with cardiology before this decision is made. Once we have clearance from cardiology week in scheduling him from an open inguinal hernia repair in the outpatient setting.I have explained the procedure, risks, and aftercare of inguinal hernia repair to Lance Diaz.   Risks include but are not limited to bleeding, infection, wound problems, anesthesia, recurrence, bladder or intestine injury, urinary retention, testicular dysfunction, chronic pain, mesh problems.  He  seems to understand and agrees to proceed.  Questions were answered to his stated satisfaction.  Caroleen Hamman, MD FACS General Surgeon 04/17/2017, 10:03 AM

## 2017-05-01 NOTE — Anesthesia Post-op Follow-up Note (Cosign Needed)
Anesthesia QCDR form completed.        

## 2017-05-01 NOTE — Anesthesia Procedure Notes (Signed)
Procedure Name: Intubation Performed by: Lance Muss Pre-anesthesia Checklist: Patient identified, Patient being monitored, Timeout performed, Emergency Drugs available and Suction available Patient Re-evaluated:Patient Re-evaluated prior to inductionOxygen Delivery Method: Circle system utilized Preoxygenation: Pre-oxygenation with 100% oxygen Intubation Type: IV induction Ventilation: Mask ventilation without difficulty Laryngoscope Size: Mac and 4 Grade View: Grade I Tube type: Oral Tube size: 7.5 mm Number of attempts: 1 Airway Equipment and Method: Stylet Placement Confirmation: ETT inserted through vocal cords under direct vision,  positive ETCO2 and breath sounds checked- equal and bilateral Secured at: 22 cm Tube secured with: Tape Dental Injury: Teeth and Oropharynx as per pre-operative assessment

## 2017-05-01 NOTE — Discharge Instructions (Signed)

## 2017-05-01 NOTE — Interval H&P Note (Signed)
History and Physical Interval Note:  05/01/2017 10:04 AM  Lance Diaz  has presented today for surgery, with the diagnosis of Recurrent inguinal hernia  The various methods of treatment have been discussed with the patient and family. After consideration of risks, benefits and other options for treatment, the patient has consented to  Procedure(s): HERNIA REPAIR INGUINAL ADULT (Right) INSERTION OF MESH (N/A) as a surgical intervention .  The patient's history has been reviewed, patient examined, no change in status, stable for surgery.  I have reviewed the patient's chart and labs.  Questions were answered to the patient's satisfaction.      [place]

## 2017-05-01 NOTE — Anesthesia Preprocedure Evaluation (Signed)
Anesthesia Evaluation  Patient identified by MRN, date of birth, ID band Patient awake    Reviewed: Allergy & Precautions, H&P , NPO status , Patient's Chart, lab work & pertinent test results, reviewed documented beta blocker date and time   Airway Mallampati: II  TM Distance: >3 FB Neck ROM: full    Dental  (+) Teeth Intact, Upper Dentures   Pulmonary neg pulmonary ROS, shortness of breath and with exertion, former smoker,    Pulmonary exam normal        Cardiovascular Exercise Tolerance: Good hypertension, On Medications + Peripheral Vascular Disease  negative cardio ROS Normal cardiovascular exam+ pacemaker  Rhythm:regular Rate:Normal     Neuro/Psych negative neurological ROS  negative psych ROS   GI/Hepatic negative GI ROS, Neg liver ROS, GERD  Medicated,  Endo/Other  negative endocrine ROSdiabetes  Renal/GU CRFRenal diseasenegative Renal ROS  negative genitourinary   Musculoskeletal   Abdominal   Peds  Hematology negative hematology ROS (+) anemia ,   Anesthesia Other Findings Past Medical History: No date: Anemia No date: Chronic kidney disease No date: Diabetes mellitus without complication ([facility]) No date: Diverticulosis No date: Dyspnea     Comment: with exersion No date: Erectile dysfunction No date: GERD (gastroesophageal reflux disease) No date: High cholesterol No date: Peripheral vascular disease ([facility]) No date: Presence of permanent cardiac pacemaker Past Surgical History: No date: cirmucision 2014: HERNIA REPAIR Right 05/24/2016: PACEMAKER INSERTION Left     Comment: Procedure: INSERTION PACEMAKER;  Surgeon:               Isaias Cowman, MD;  Location: [facility] ORS;                Service: Cardiovascular;  Laterality: Left; BMI    Body Mass Index:  22.73 kg/m     Reproductive/Obstetrics negative OB ROS                             Anesthesia Physical Anesthesia  Plan  ASA: III  Anesthesia Plan: General ETT   Post-op Pain Management:    Induction:   Airway Management Planned:   Additional Equipment:   Intra-op Plan:   Post-operative Plan:   Informed Consent: I have reviewed the patients History and Physical, chart, labs and discussed the procedure including the risks, benefits and alternatives for the proposed anesthesia with the patient or authorized representative who has indicated his/her understanding and acceptance.   Dental Advisory Given  Plan Discussed with: CRNA  Anesthesia Plan Comments:         Anesthesia Quick Evaluation

## 2017-05-01 NOTE — Transfer of Care (Signed)
Immediate Anesthesia Transfer of Care Note  Patient: [name]  Procedure(s) Performed: Procedure(s): HERNIA REPAIR INGUINAL ADULT (Right) INSERTION OF MESH (N/A)  Patient Location: PACU  Anesthesia Type:General  Level of Consciousness: sedated and responds to stimulation  Airway & Oxygen Therapy: Patient Spontanous Breathing and Patient connected to face mask oxygen  Post-op Assessment: Report given to RN and Post -op Vital signs reviewed and stable  Post vital signs: Reviewed and stable  Last Vitals:  Vitals:   05/01/17 1334 05/01/17 1335  BP:  135/60  Pulse:  61  Resp:  16  Temp: (P) 36.3 C     Last Pain:  Vitals:   05/01/17 1334  TempSrc:   PainSc: (P) Asleep         Complications: No apparent anesthesia complications

## 2017-05-01 NOTE — Op Note (Signed)
PROCEDURE: Open repair of a right recurrent inguinal hernia repair with mesh using the medium ultra Pro hernia system   Pre-operative Diagnosis: Recurrent right inguinal hernia  Post-operative Diagnosis: Same  Surgeon: Marjory Lies Pabon   Anesthesia: General endotracheal anesthesia  ASA Class: 2  Surgeon: Caroleen Hamman , MD FACS  Anesthesia: Gen. with endotracheal tube  Findings: Recurrent direct inguinal hernia  Estimated Blood Loss: 10cc         Drains: none              Complications: none               Condition: stable  Procedure Details  The patient was seen again in the Holding Room. The benefits, complications, treatment options, and expected outcomes were discussed with the patient. The risks of bleeding, infection, recurrence of symptoms, failure to resolve symptoms,  bowel injury, any of which could require further surgery were reviewed with the patient.   The patient was taken to Operating Room, identified as Lance Diaz and the procedure verified.  A Time Out was held and the above information confirmed.  Prior to the induction of general anesthesia, antibiotic prophylaxis was administered. VTE prophylaxis was in place. General endotracheal anesthesia was then administered and tolerated well. After the induction, the abdomen was prepped with Chloraprep and draped in the sterile fashion. The patient was positioned in the supine position.  An inguinal incision was created with a 10 blade knife and electrocautery was used to dissect through the subcutaneous tissue. The external oblique fascia was incised in the standard fashion. The spermatic cord was identified and dissected free from adjacent structures and a Penrose was placed around it. I were able to dissect the floor of the inguinal canal in a circumferential fashion and we did find the defect that was medial to the previous mesh. At this defect was also medial to the epigastric vessels. We dissected the hernia sac  circumferentially and placed a medium-sized ultra Pro mesh system and I passed the extraperitoneal portion using interrupted Ethibond sutures to the shelving edge of the inguinal ligament medially and to the conjoined tendon on the medial side. We were able to trim the outer portion of the mesh to take her to the adequate size of the inguinal canal. We protected the nerve at all times and visualized. The outer layer of the mesh was secure in place using interrupted 0 Ethibond sutures in the standard fashion to the shelving edge of the anal ligament and to the conjoined tendon medially. We completely reduced the hernia and there was an very adequate repair in a tension-free fashion. The shelving fascia was closed with a 2-0 Vicryl in a running fashion and Scarpa's fascia was closed with 3-0 Vicryl. Liposomal Marcaine was used for postoperative analgesia and to block the ilioinguinal nerve.  The subcutaneous tissue was closed with 3-0 Vicryl and skin was closed with a 4-0 Monocryl in a subcuticular fashion. Dermabond was used to coat the skin.  Patient tolerated procedure well and there were no immediate complications. Needle and laparotomy counts were correct.  Caroleen Hamman, MD, FACS

## 2017-05-01 NOTE — Anesthesia Postprocedure Evaluation (Signed)
Anesthesia Post Note  Patient: JADAN HINOJOS  Procedure(s) Performed: Procedure(s) (LRB): HERNIA REPAIR INGUINAL ADULT (Right) INSERTION OF MESH (N/A)  Patient location during evaluation: PACU Anesthesia Type: General Level of consciousness: awake and alert Pain management: pain level controlled Vital Signs Assessment: post-procedure vital signs reviewed and stable Respiratory status: spontaneous breathing, nonlabored ventilation, respiratory function stable and patient connected to nasal cannula oxygen Cardiovascular status: blood pressure returned to baseline and stable Postop Assessment: no signs of nausea or vomiting Anesthetic complications: no     Last Vitals:  Vitals:   05/01/17 1421 05/01/17 1505  BP: (!) 162/74 127/60  Pulse: 63   Resp: 14   Temp:      Last Pain:  Vitals:   05/01/17 1421  TempSrc:   PainSc: 2                  Molli Barrows

## 2017-05-02 ENCOUNTER — Encounter: Payer: Self-pay | Admitting: Surgery

## 2017-05-07 ENCOUNTER — Telehealth: Payer: Self-pay

## 2017-05-07 NOTE — Telephone Encounter (Signed)
Patient had an inguinal hernia repair on 05/01/2017 with Dr. Dahlia Byes. He is calling because he is really sore, and it hurts near his privates. Patient is hard of hearing, he put his wife on the phone. Patient's wife states that his pain medication is not working and that she has been applying ice, that is not working neither. Please call patient and advice.

## 2017-05-07 NOTE — Telephone Encounter (Signed)
Right groin to right of testicles. Restarted eliquis day after surgery. Swelling noticed right under incision that is soft per patient. Taking pain medication (Hydrocodone) every six hours around the clock. Pain has not increased but has not gotten worse. He has not tried ice or Ibuprofen but states that his Arthritis Strength Tylenol helped more than the pain medication. But he only has 10 Hydrocodone left at this time.  I instructed patient to continue taking Hydrocodone as needed for severe pain but to not take this medication with additional Tylenol. He was told to begin taking Ibuprofen 800mg  PO TID in between pain medication. He was also instructed to elevate his scrotum to help with swelling and to use ice to help with inflammation. He verbalizes understanding and will return call to office tomorrow morning if he does all of these things over night without any relief.

## 2017-05-09 ENCOUNTER — Telehealth: Payer: Self-pay

## 2017-05-09 NOTE — Telephone Encounter (Signed)
Patient is calling because it is hard and slightly swollen near his surgical site. He had an inguinal hernia repair. I explained that this is normal. He wanted to know about pain medication. I read the note documented yesterday by Amber and let them know that a medication was not called in. Amber advised him to take ibuprofen 800 mg PO TID in between his hydrocodone. Patient understood and had no further questions.

## 2017-05-15 ENCOUNTER — Other Ambulatory Visit: Payer: Self-pay

## 2017-05-21 ENCOUNTER — Ambulatory Visit (INDEPENDENT_AMBULATORY_CARE_PROVIDER_SITE_OTHER): Payer: Medicare Other | Admitting: Surgery

## 2017-05-21 ENCOUNTER — Encounter: Payer: Self-pay | Admitting: Surgery

## 2017-05-21 VITALS — BP 124/75 | HR 85 | Temp 97.8°F | Wt 177.0 lb

## 2017-05-21 DIAGNOSIS — Z09 Encounter for follow-up examination after completed treatment for conditions other than malignant neoplasm: Secondary | ICD-10-CM

## 2017-05-21 NOTE — Patient Instructions (Signed)
Please call our office with any questions or concerns.  Please do not submerge in a tub, hot tub, or pool until incisions are completely sealed.  Use sun block to incision area over the next year if this area will be exposed to sun. This helps decrease scarring.  You may resume your normal activities on 06/12/2017. At that time- Listen to your body when lifting, if you have pain when lifting, stop and then try again in a few days. Pain after doing exercises or activities of daily living is normal as you get back in to your normal routine.  If you develop redness, drainage, or pain at incision sites- call our office immediately and speak with a nurse.

## 2017-05-21 NOTE — Progress Notes (Signed)
S/p open RIH repair Doing well Some mild pain and decrease appetite No other issues  PE NAD Abd: soft, NT, incision c/d/i. No recurrence. Some mild expected thickening from second mesh.  A/p Doing well No heavy lifting RTC prn

## 2017-08-22 DIAGNOSIS — E1121 Type 2 diabetes mellitus with diabetic nephropathy: Secondary | ICD-10-CM | POA: Insufficient documentation

## 2018-05-15 DIAGNOSIS — Z0000 Encounter for general adult medical examination without abnormal findings: Secondary | ICD-10-CM | POA: Insufficient documentation

## 2021-01-27 DIAGNOSIS — N138 Other obstructive and reflux uropathy: Secondary | ICD-10-CM | POA: Insufficient documentation

## 2021-12-26 ENCOUNTER — Other Ambulatory Visit: Payer: Self-pay

## 2021-12-26 ENCOUNTER — Ambulatory Visit (INDEPENDENT_AMBULATORY_CARE_PROVIDER_SITE_OTHER): Payer: Medicare Other | Admitting: Nurse Practitioner

## 2021-12-26 ENCOUNTER — Encounter (INDEPENDENT_AMBULATORY_CARE_PROVIDER_SITE_OTHER): Payer: Self-pay | Admitting: Nurse Practitioner

## 2021-12-26 VITALS — BP 129/69 | HR 90 | Resp 18 | Ht 74.0 in | Wt 178.0 lb

## 2021-12-26 DIAGNOSIS — I83813 Varicose veins of bilateral lower extremities with pain: Secondary | ICD-10-CM

## 2021-12-26 DIAGNOSIS — E119 Type 2 diabetes mellitus without complications: Secondary | ICD-10-CM | POA: Diagnosis not present

## 2021-12-26 DIAGNOSIS — I10 Essential (primary) hypertension: Secondary | ICD-10-CM

## 2021-12-26 NOTE — Progress Notes (Signed)
Subjective:    Patient ID: Lance Diaz, male    DOB: 26-Mar-1931, 88 y.o.   MRN: [id number] Chief Complaint  Patient presents with   New Patient (Initial Visit)    Consult     Lance Diaz is an 88 year old male is seen for evaluation of symptomatic varicose veins. The patient relates burning and stinging which worsened steadily throughout the course of the day, particularly with standing. The patient also notes an aching and throbbing pain over the varicosities, particularly with prolonged dependent positions. The symptoms are significantly improved with elevation.  The patient also notes that during hot weather the symptoms are greatly intensified. The patient states the pain from the varicose veins interferes with work, daily exercise, shopping and household maintenance. At this point, the symptoms are persistent and severe enough that they're having a negative impact on lifestyle and are interfering with daily activities.  He notes that the varicosity over his left knee is much worse and much more painful.  There is no history of DVT, PE or superficial thrombophlebitis. There is no history of ulceration or hemorrhage. The patient denies a significant family history of varicose veins.   The patient has not worn graduated compression in the past consistently. At the present time the patient has not been using over-the-counter analgesics. There is no history of prior surgical intervention or sclerotherapy.      Review of Systems  Cardiovascular:  Positive for leg swelling.  All other systems reviewed and are negative.     Objective:   Physical Exam Vitals reviewed.  HENT:     Head: Normocephalic.  Cardiovascular:     Rate and Rhythm: Normal rate.     Pulses: Decreased pulses.  Pulmonary:     Effort: Pulmonary effort is normal.  Musculoskeletal:     Left lower leg: Edema present.  Skin:    General: Skin is warm and dry.  Neurological:     Mental Status: He is alert  and oriented to person, place, and time.  Psychiatric:        Mood and Affect: Mood normal.        Behavior: Behavior normal.        Thought Content: Thought content normal.        Judgment: Judgment normal.    BP 129/69 (BP Location: Left Arm)    Pulse 90    Resp 18    Ht 6\' 2"  (1.88 m)    Wt 178 lb (80.7 kg)    BMI 22.85 kg/m   Past Medical History:  Diagnosis Date   Anemia    Chronic kidney disease    Diabetes mellitus without complication (HCC)    Diverticulosis    Dyspnea    with exersion   Erectile dysfunction    GERD (gastroesophageal reflux disease)    High cholesterol    Peripheral vascular disease (HCC)    Presence of permanent cardiac pacemaker     Social History   Socioeconomic History   Marital status: Married    Spouse name: Not on file   Number of children: Not on file   Years of education: Not on file   Highest education level: Not on file  Occupational History   Not on file  Tobacco Use   Smoking status: Former    Types: Cigarettes    Quit date: 05/16/1986    Years since quitting: 35.6   Smokeless tobacco: Never  Substance and Sexual Activity   Alcohol use: No  Drug use: No   Sexual activity: Never  Other Topics Concern   Not on file  Social History Narrative   Not on file   Social Determinants of Health   Financial Resource Strain: Not on file  Food Insecurity: Not on file  Transportation Needs: Not on file  Physical Activity: Not on file  Stress: Not on file  Social Connections: Not on file  Intimate Partner Violence: Not on file    Past Surgical History:  Procedure Laterality Date   cirmucision     HERNIA REPAIR Right 2014   INGUINAL HERNIA REPAIR Right 05/01/2017   Procedure: HERNIA REPAIR INGUINAL ADULT;  Surgeon: Jules Husbands, MD;  Location: [facility];  Service: General;  Laterality: Right;   INSERTION OF MESH N/A 05/01/2017   Procedure: INSERTION OF MESH;  Surgeon: Jules Husbands, MD;  Location: [facility];  Service: General;   Laterality: N/A;   PACEMAKER INSERTION Left 05/24/2016   Procedure: INSERTION PACEMAKER;  Surgeon: Isaias Cowman, MD;  Location: [facility];  Service: Cardiovascular;  Laterality: Left;    Family History  Problem Relation Age of Onset   Cancer Daughter    Cancer Father        Prostate Cancer   Renal Disease Mother    Coronary artery disease Sister     Allergies  Allergen Reactions   Ace Inhibitors Other (See Comments)    Headaches   Iodine Swelling    Topical     CBC Latest Ref Rng & Units 04/20/2017 05/16/2016 08/14/2015  WBC 3.8 - 10.6 K/uL 5.3 5.8 5.8  Hemoglobin 13.0 - 18.0 g/dL 12.2(L) 12.3(L) 12.6(L)  Hematocrit 40.0 - 52.0 % 35.8(L) 37.0(L) 38.0(L)  Platelets 150 - 440 K/uL 248 220 250      CMP     Component Value Date/Time   NA 140 04/20/2017 1140   NA 139 12/15/2013 1348   K 3.6 04/20/2017 1140   K 4.2 12/15/2013 1348   CL 107 04/20/2017 1140   CL 108 (H) 12/15/2013 1348   CO2 28 04/20/2017 1140   CO2 28 12/15/2013 1348   GLUCOSE 114 (H) 04/20/2017 1140   GLUCOSE 84 12/15/2013 1348   BUN 16 04/20/2017 1140   BUN 14 12/15/2013 1348   CREATININE 1.46 (H) 04/20/2017 1140   CREATININE 1.25 12/15/2013 1348   CALCIUM 9.1 04/20/2017 1140   CALCIUM 9.0 12/15/2013 1348   PROT 7.4 08/14/2015 0945   ALBUMIN 3.9 08/14/2015 0945   AST 19 08/14/2015 0945   ALT 11 (L) 08/14/2015 0945   ALKPHOS 69 08/14/2015 0945   BILITOT 1.0 08/14/2015 0945   GFRNONAA 42 (L) 04/20/2017 1140   GFRNONAA 54 (L) 12/15/2013 1348   GFRAA 49 (L) 04/20/2017 1140   GFRAA >60 12/15/2013 1348     No results found.     Assessment & Plan:   1. Varicose veins of both lower extremities with pain Recommend:  The patient has large symptomatic varicose veins that are painful and associated with swelling.  I have had a long discussion with the patient regarding  varicose veins and why they cause symptoms.  Patient will begin wearing graduated compression stockings class 1 on a daily  basis, beginning first thing in the morning and removing them in the evening. The patient is instructed specifically not to sleep in the stockings.    The patient  will also begin using over-the-counter analgesics such as Motrin 600 mg po TID to help control the symptoms.  In addition, behavioral modification including elevation during the day will be initiated.    Pending the results of these changes the  patient will be reevaluated in three months.   An  ultrasound of the venous system will be obtained.   Further plans will be based on the ultrasound results and whether conservative therapies are successful at eliminating the pain and swelling.    2. Benign essential HTN Continue antihypertensive medications as already ordered, these medications have been reviewed and there are no changes at this time.   3. Controlled type 2 diabetes mellitus without complication, unspecified whether long term insulin use ([place]) Continue hypoglycemic medications as already ordered, these medications have been reviewed and there are no changes at this time.  Hgb A1C to be monitored as already arranged by primary service    Current Outpatient Medications on File Prior to Visit  Medication Sig Dispense Refill   acetaminophen (TYLENOL) 650 MG CR tablet Take 650 mg by mouth every 8 (eight) hours as needed for pain.     apixaban (ELIQUIS) 5 MG TABS tablet Take 5 mg by mouth 2 (two) times daily.     diltiazem (TIAZAC) 120 MG 24 hr capsule Take 120 mg by mouth every evening.      ibuprofen (ADVIL,MOTRIN) 200 MG tablet Take 1 tablet by mouth every 6 (six) hours as needed.     Iron-Vitamin C 65-125 MG TABS Take 1 tablet by mouth daily.      metFORMIN (GLUCOPHAGE) 500 MG tablet Take 500 mg by mouth 2 (two) times daily.     sildenafil (VIAGRA) 50 MG tablet Take by mouth.     valACYclovir (VALTREX) 1000 MG tablet Take 1,000 mg by mouth 2 (two) times daily as needed (outbreaks).      vitamin B-12 (CYANOCOBALAMIN)  1000 MCG tablet Take 1,000 mcg by mouth daily.     clindamycin (CLEOCIN T) 1 % external solution Apply 1 application topically 2 (two) times daily as needed (rash).  (Patient not taking: Reported on 12/26/2021)     pantoprazole (PROTONIX) 40 MG tablet Take 40 mg by mouth daily as needed (indigestion).      vardenafil (LEVITRA) 20 MG tablet Take 20 mg by mouth daily as needed for erectile dysfunction. (Patient not taking: Reported on 12/26/2021)     No current facility-administered medications on file prior to visit.    There are no Patient Instructions on file for this visit. No follow-ups on file.   Kris Hartmann, NP

## 2022-02-02 ENCOUNTER — Encounter (INDEPENDENT_AMBULATORY_CARE_PROVIDER_SITE_OTHER): Payer: Medicare Other

## 2022-02-02 ENCOUNTER — Ambulatory Visit (INDEPENDENT_AMBULATORY_CARE_PROVIDER_SITE_OTHER): Payer: TRICARE For Life (TFL) | Admitting: Nurse Practitioner

## 2022-02-16 ENCOUNTER — Other Ambulatory Visit: Payer: Self-pay | Admitting: Unknown Physician Specialty

## 2022-02-16 DIAGNOSIS — K1122 Acute recurrent sialoadenitis: Secondary | ICD-10-CM

## 2022-02-21 ENCOUNTER — Other Ambulatory Visit (INDEPENDENT_AMBULATORY_CARE_PROVIDER_SITE_OTHER): Payer: Self-pay | Admitting: Nurse Practitioner

## 2022-02-21 DIAGNOSIS — I83813 Varicose veins of bilateral lower extremities with pain: Secondary | ICD-10-CM

## 2022-02-22 ENCOUNTER — Other Ambulatory Visit: Payer: Self-pay

## 2022-02-22 ENCOUNTER — Encounter (INDEPENDENT_AMBULATORY_CARE_PROVIDER_SITE_OTHER): Payer: Self-pay | Admitting: Nurse Practitioner

## 2022-02-22 ENCOUNTER — Ambulatory Visit (INDEPENDENT_AMBULATORY_CARE_PROVIDER_SITE_OTHER): Payer: Medicare Other | Admitting: Nurse Practitioner

## 2022-02-22 ENCOUNTER — Ambulatory Visit (INDEPENDENT_AMBULATORY_CARE_PROVIDER_SITE_OTHER): Payer: Medicare Other

## 2022-02-22 VITALS — BP 142/82 | HR 91 | Resp 16 | Wt 171.0 lb

## 2022-02-22 DIAGNOSIS — I83813 Varicose veins of bilateral lower extremities with pain: Secondary | ICD-10-CM

## 2022-02-22 DIAGNOSIS — I10 Essential (primary) hypertension: Secondary | ICD-10-CM | POA: Diagnosis not present

## 2022-02-22 NOTE — Progress Notes (Signed)
? ?Subjective:  ? ? Patient ID: Lance Diaz, male    DOB: January 08, 1931, 88 y.o.   MRN: [id number] ?Chief Complaint  ?Patient presents with  ? Follow-up  ?  Ultrasound follow up  ? ? ?Lance Diaz is an 88 year old male that returns today for follow-up evaluation of varicosities.  The patient's granddaughter is with him and also helps provide much of the history.  The patient notes that there is a varicosity on his left knee that is causing concern.  The varicosity does not necessarily hurt but he was concerned that it may be an indication there is a blood clot or something worse ongoing in his leg.  He does however wear compression socks daily and denies any significant issues with swelling. ? ?Today noninvasive studies show deep venous insufficiency in the right lower extremity.  No deep venous insufficiency in the left.  There is reflux in the left small saphenous vein.  There is also noted to be chronic occlusive thrombus. ? ? ?Review of Systems  ?Cardiovascular:  Negative for leg swelling.  ?All other systems reviewed and are negative. ? ?   ?Objective:  ? Physical Exam ?Vitals reviewed.  ?HENT:  ?   Head: Normocephalic.  ?Cardiovascular:  ?   Rate and Rhythm: Normal rate.  ?   Pulses: Decreased pulses.  ?Pulmonary:  ?   Effort: Pulmonary effort is normal.  ?Skin: ?   General: Skin is warm and dry.  ?Neurological:  ?   Mental Status: He is alert and oriented to person, place, and time.  ?Psychiatric:     ?   Mood and Affect: Mood normal.     ?   Behavior: Behavior normal.     ?   Thought Content: Thought content normal.     ?   Judgment: Judgment normal.  ? ? ?BP (!) 142/82 (BP Location: Left Arm)   Pulse 91   Resp 16   Wt 171 lb (77.6 kg)   BMI 21.96 kg/m?  ? ?Past Medical History:  ?Diagnosis Date  ? Anemia   ? Chronic kidney disease   ? Diabetes mellitus without complication ([place])   ? Diverticulosis   ? Dyspnea   ? with exersion  ? Erectile dysfunction   ? GERD (gastroesophageal reflux disease)   ?  High cholesterol   ? Peripheral vascular disease ([place])   ? Presence of permanent cardiac pacemaker   ? ? ?Social History  ? ?Socioeconomic History  ? Marital status: Married  ?  Spouse name: Not on file  ? Number of children: Not on file  ? Years of education: Not on file  ? Highest education level: Not on file  ?Occupational History  ? Not on file  ?Tobacco Use  ? Smoking status: Former  ?  Types: Cigarettes  ?  Quit date: 05/16/1986  ?  Years since quitting: 35.7  ? Smokeless tobacco: Never  ?Substance and Sexual Activity  ? Alcohol use: No  ? Drug use: No  ? Sexual activity: Never  ?Other Topics Concern  ? Not on file  ?Social History Narrative  ? Not on file  ? ?Social Determinants of Health  ? ?Financial Resource Strain: Not on file  ?Food Insecurity: Not on file  ?Transportation Needs: Not on file  ?Physical Activity: Not on file  ?Stress: Not on file  ?Social Connections: Not on file  ?Intimate Partner Violence: Not on file  ? ? ?Past Surgical History:  ?Procedure Laterality Date  ?  cirmucision    ? HERNIA REPAIR Right 2014  ? INGUINAL HERNIA REPAIR Right 05/01/2017  ? Procedure: HERNIA REPAIR INGUINAL ADULT;  Surgeon: Jules Husbands, MD;  Location: [facility];  Service: General;  Laterality: Right;  ? INSERTION OF MESH N/A 05/01/2017  ? Procedure: INSERTION OF MESH;  Surgeon: Jules Husbands, MD;  Location: [facility];  Service: General;  Laterality: N/A;  ? PACEMAKER INSERTION Left 05/24/2016  ? Procedure: INSERTION PACEMAKER;  Surgeon: Isaias Cowman, MD;  Location: [facility];  Service: Cardiovascular;  Laterality: Left;  ? ? ?Family History  ?Problem Relation Age of Onset  ? Cancer Daughter   ? Cancer Father   ?     Prostate Cancer  ? Renal Disease Mother   ? Coronary artery disease Sister   ? ? ?Allergies  ?Allergen Reactions  ? Ace Inhibitors Other (See Comments)  ?  Headaches  ? Iodine Swelling  ?  Topical   ? Tamsulosin Hives  ? ? ? ?  Latest Ref Rng & Units 04/20/2017  ? 11:40 AM 05/16/2016  ?  1:12 PM  08/14/2015  ?  9:45 AM  ?CBC  ?WBC 3.8 - 10.6 K/uL 5.3   5.8   5.8    ?Hemoglobin 13.0 - 18.0 g/dL 12.2   12.3   12.6    ?Hematocrit 40.0 - 52.0 % 35.8   37.0   38.0    ?Platelets 150 - 440 K/uL 248   220   250    ? ? ? ? ?CMP  ?   ?Component Value Date/Time  ? NA 140 04/20/2017 1140  ? NA 139 12/15/2013 1348  ? K 3.6 04/20/2017 1140  ? K 4.2 12/15/2013 1348  ? CL 107 04/20/2017 1140  ? CL 108 (H) 12/15/2013 1348  ? CO2 28 04/20/2017 1140  ? CO2 28 12/15/2013 1348  ? GLUCOSE 114 (H) 04/20/2017 1140  ? GLUCOSE 84 12/15/2013 1348  ? BUN 16 04/20/2017 1140  ? BUN 14 12/15/2013 1348  ? CREATININE 1.46 (H) 04/20/2017 1140  ? CREATININE 1.25 12/15/2013 1348  ? CALCIUM 9.1 04/20/2017 1140  ? CALCIUM 9.0 12/15/2013 1348  ? PROT 7.4 08/14/2015 0945  ? ALBUMIN 3.9 08/14/2015 0945  ? AST 19 08/14/2015 0945  ? ALT 11 (L) 08/14/2015 0945  ? ALKPHOS 69 08/14/2015 0945  ? BILITOT 1.0 08/14/2015 0945  ? GFRNONAA 42 (L) 04/20/2017 1140  ? GFRNONAA 54 (L) 12/15/2013 1348  ? GFRAA 49 (L) 04/20/2017 1140  ? GFRAA >60 12/15/2013 1348  ? ? ? ?No results found. ? ?   ?Assessment & Plan:  ? ?1. Varicose veins of both lower extremities with pain ?Currently the patient is not having any pain in the varicosity.  It is more so concerned that there were bigger issues underlying.  We discussed sclerotherapy to treat it however he does not wish to move forward at this time.  The patient will contact our office to follow-up if there are any further issues. ? ?2. Benign essential HTN ?Continue antihypertensive medications as already ordered, these medications have been reviewed and there are no changes at this time.  ? ? ?Current Outpatient Medications on File Prior to Visit  ?Medication Sig Dispense Refill  ? acetaminophen (TYLENOL) 650 MG CR tablet Take 650 mg by mouth every 8 (eight) hours as needed for pain.    ? apixaban (ELIQUIS) 5 MG TABS tablet Take 5 mg by mouth 2 (two) times daily.    ? diltiazem (  TIAZAC) 120 MG 24 hr capsule Take 120 mg  by mouth every evening.     ? ibuprofen (ADVIL,MOTRIN) 200 MG tablet Take 1 tablet by mouth every 6 (six) hours as needed.    ? Iron-Vitamin C 65-125 MG TABS Take 1 tablet by mouth daily.     ? sildenafil (VIAGRA) 50 MG tablet Take by mouth.    ? valACYclovir (VALTREX) 1000 MG tablet Take 1,000 mg by mouth 2 (two) times daily as needed (outbreaks).     ? vitamin B-12 (CYANOCOBALAMIN) 1000 MCG tablet Take 1,000 mcg by mouth daily.    ? clindamycin (CLEOCIN T) 1 % external solution Apply 1 application topically 2 (two) times daily as needed (rash).  (Patient not taking: Reported on 12/26/2021)    ? metFORMIN (GLUCOPHAGE) 500 MG tablet Take 500 mg by mouth 2 (two) times daily. (Patient not taking: Reported on 02/22/2022)    ? pantoprazole (PROTONIX) 40 MG tablet Take 40 mg by mouth daily as needed (indigestion).     ? vardenafil (LEVITRA) 20 MG tablet Take 20 mg by mouth daily as needed for erectile dysfunction. (Patient not taking: Reported on 12/26/2021)    ? ?No current facility-administered medications on file prior to visit.  ? ? ?There are no Patient Instructions on file for this visit. ?No follow-ups on file. ? ? ?Kris Hartmann, NP ? ? ?

## 2022-03-01 ENCOUNTER — Other Ambulatory Visit: Payer: Medicare Other

## 2022-03-06 ENCOUNTER — Other Ambulatory Visit: Payer: Medicare Other

## 2022-03-07 ENCOUNTER — Ambulatory Visit
Admission: RE | Admit: 2022-03-07 | Discharge: 2022-03-07 | Disposition: A | Discharge status: Home / Self Care | Payer: Medicare Other | Source: Ambulatory Visit | Attending: Unknown Physician Specialty | Admitting: Unknown Physician Specialty

## 2022-03-07 DIAGNOSIS — K1122 Acute recurrent sialoadenitis: Secondary | ICD-10-CM

## 2022-03-07 MED ORDER — IOPAMIDOL (ISOVUE-300) INJECTION 61%
50.0000 mL | Freq: Once | INTRAVENOUS | Status: AC | PRN
  Administered 2022-03-07: 50 mL via INTRAVENOUS

## 2022-03-27 ENCOUNTER — Other Ambulatory Visit: Payer: Self-pay | Admitting: Unknown Physician Specialty

## 2022-03-27 DIAGNOSIS — K118 Other diseases of salivary glands: Secondary | ICD-10-CM

## 2022-04-04 ENCOUNTER — Ambulatory Visit
Admission: RE | Admit: 2022-04-04 | Discharge: 2022-04-04 | Disposition: A | Discharge status: Home / Self Care | Payer: Medicare Other | Source: Ambulatory Visit | Attending: Unknown Physician Specialty | Admitting: Unknown Physician Specialty

## 2022-04-04 DIAGNOSIS — K118 Other diseases of salivary glands: Secondary | ICD-10-CM

## 2022-04-04 DIAGNOSIS — D110 Benign neoplasm of parotid gland: Secondary | ICD-10-CM | POA: Insufficient documentation

## 2022-04-04 HISTORY — PX: IR US GUIDE BX ASP/DRAIN: IMG2392

## 2022-04-04 MED ORDER — LIDOCAINE HCL 1 % IJ SOLN
INTRAMUSCULAR | Status: AC
  Administered 2022-04-04: 5 mL
  Filled 2022-04-04: qty 20

## 2022-04-05 LAB — SURGICAL PATHOLOGY

## 2022-05-27 IMAGING — CT CT NECK W/ CM
3 of 4 series · 13 of 33 positions shown, 16 images · IV contrast (agent unspecified)
Comparison: None.

CLINICAL DATA: Right-sided acute recurrent sialoadenitis

EXAM:
CT NECK WITH CONTRAST
TECHNIQUE: Multidetector CT imaging of the neck was performed using the
standard protocol following the bolus administration of intravenous
contrast.

[Series 5: neck 2.0 st · sagittal · 0.46mm/px · 5 of 102 slices shown, 6 images (1 of 2)]
[im 34/102  bone]
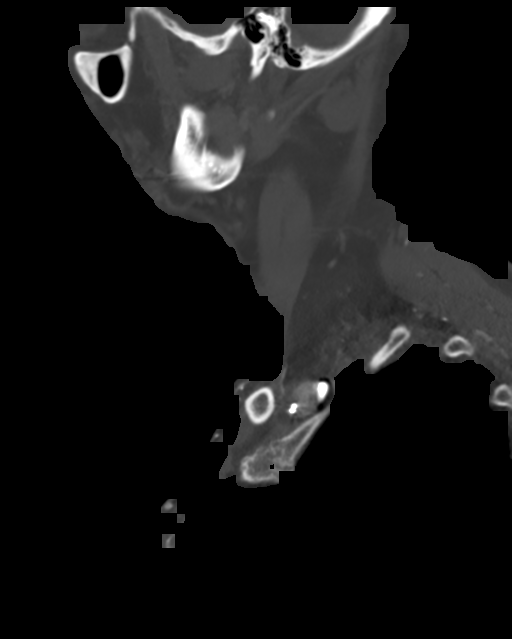
[im 43/102  bone]
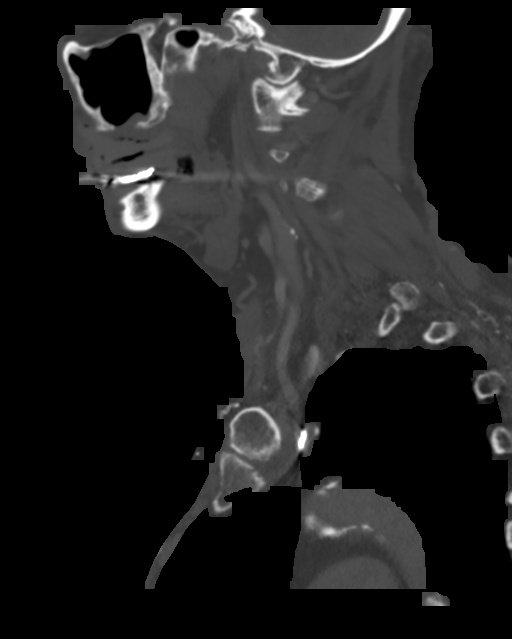
[im 51/102  soft-tissue]
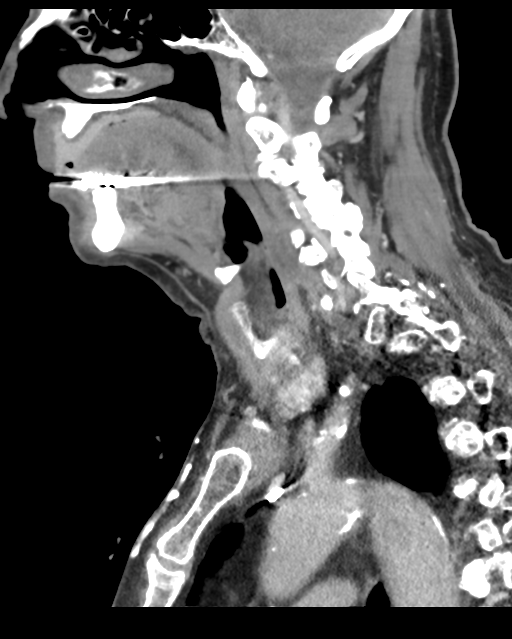
[im 51/102  bone]
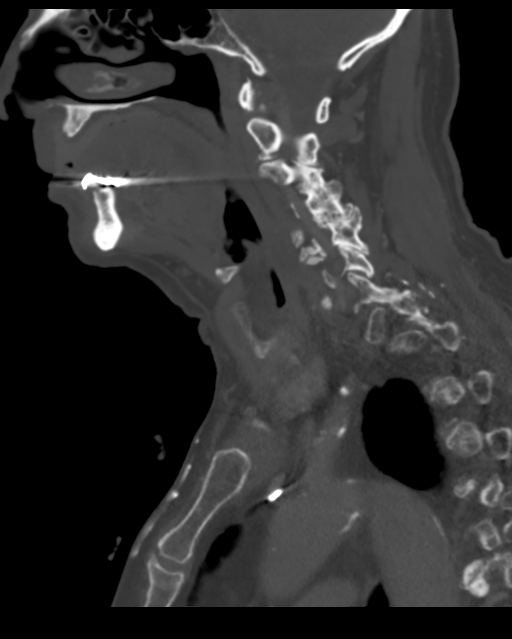
[im 59/102  bone]
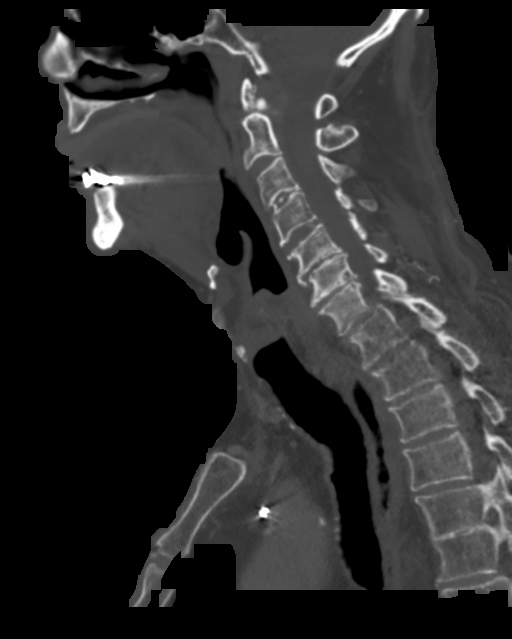
[im 68/102  bone]
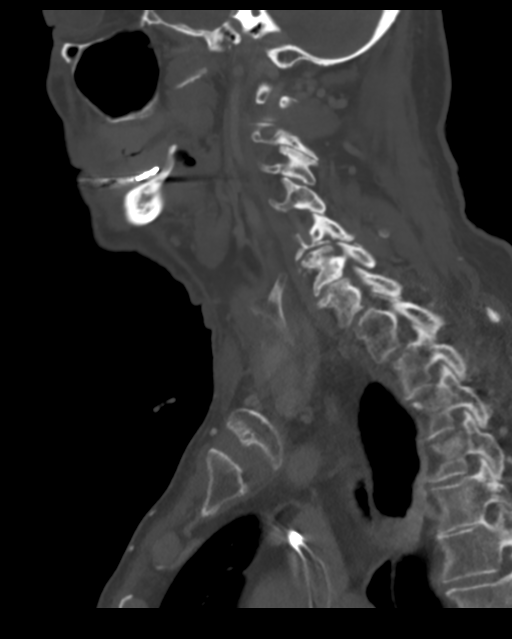

[Series 6: neck 2.0 st · coronal · 0.39mm/px · 3 of 111 slices shown (2 of 2)]
[im 105/111  bone]
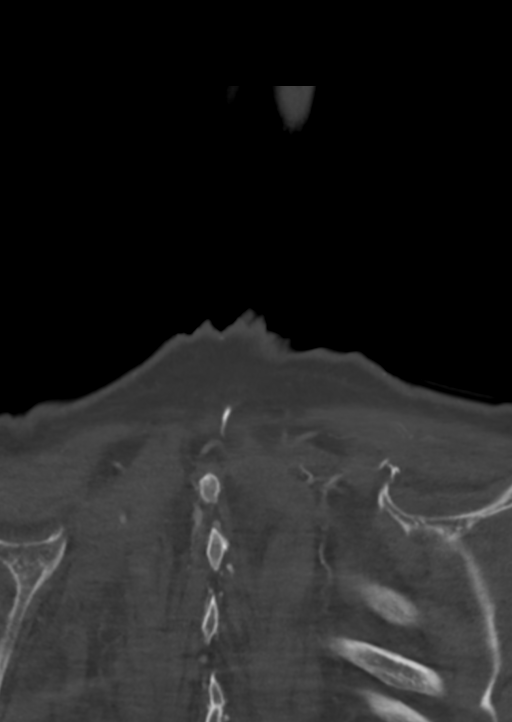
[im 106/111  bone]
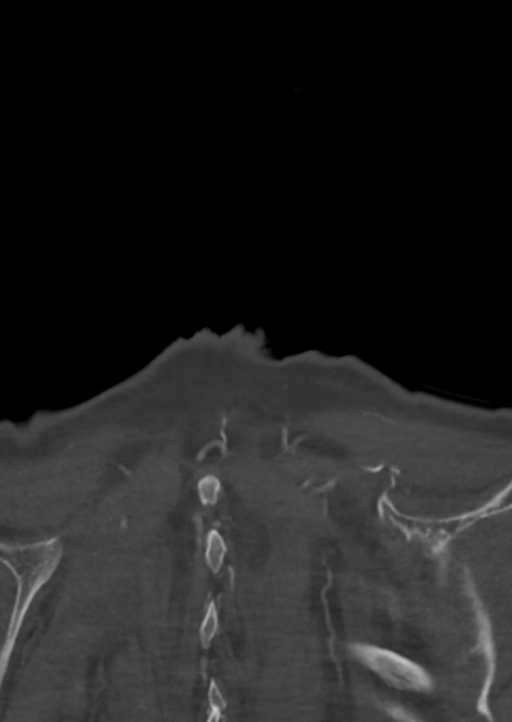
[im 107/111  bone]
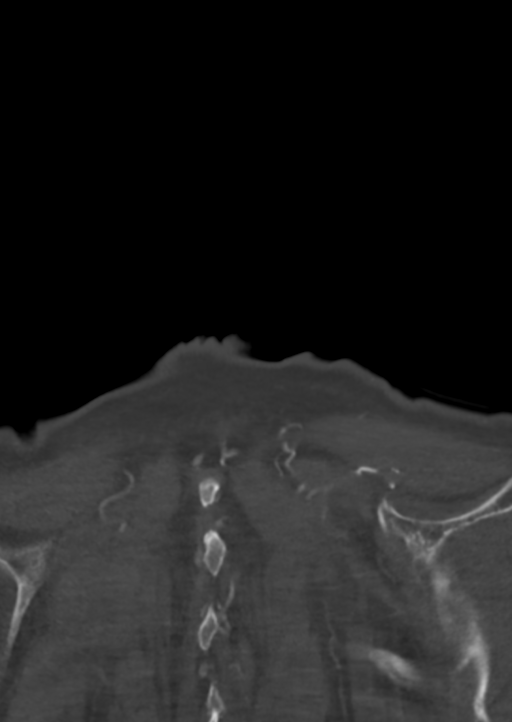

[Series 7: neck 2.0 st orthogonal · axial · 0.39mm/px · z∈[-392,-170]mm · 5 of 161 slices shown, 7 images]
[im 23/161  soft-tissue]
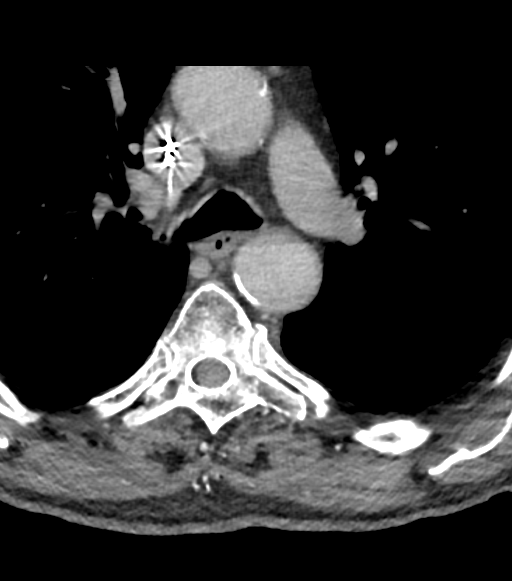
[im 23/161  bone]
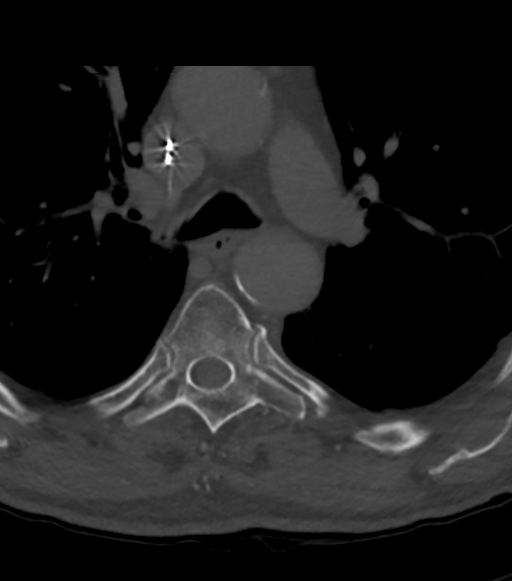
[im 46/161  bone]
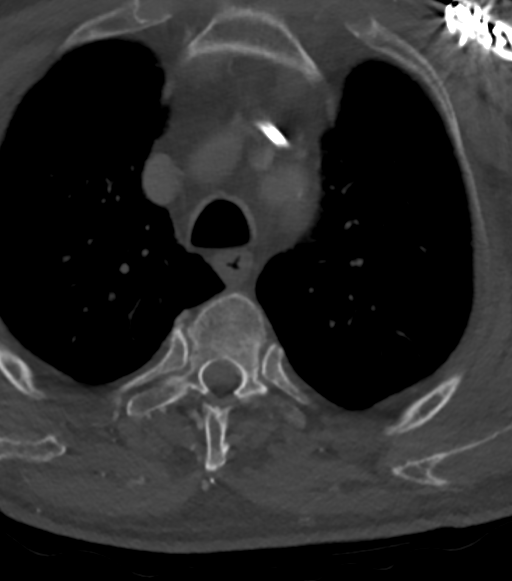
[im 92/161  bone]
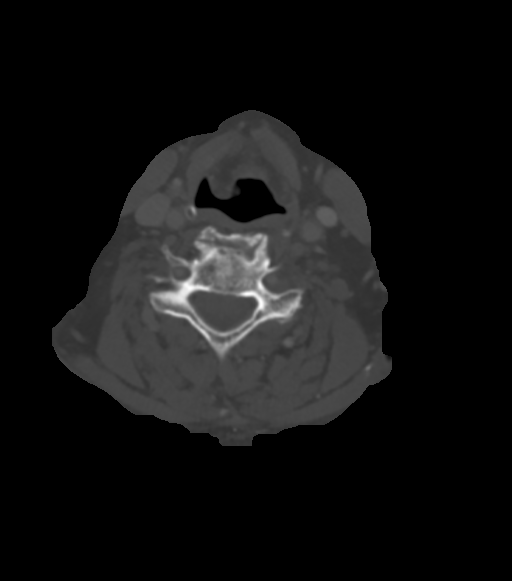
[im 115/161  bone]
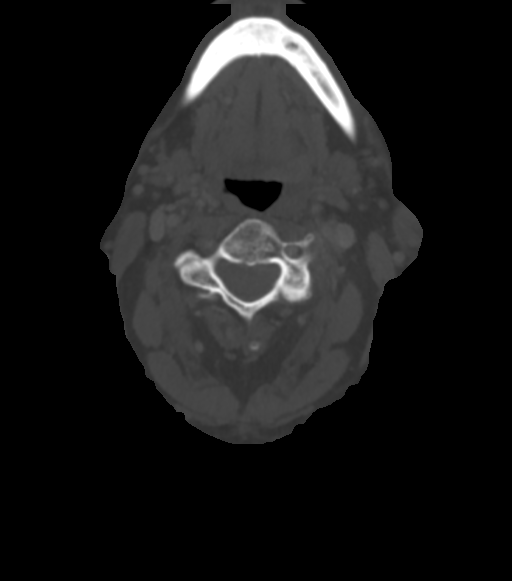
[im 138/161  soft-tissue]
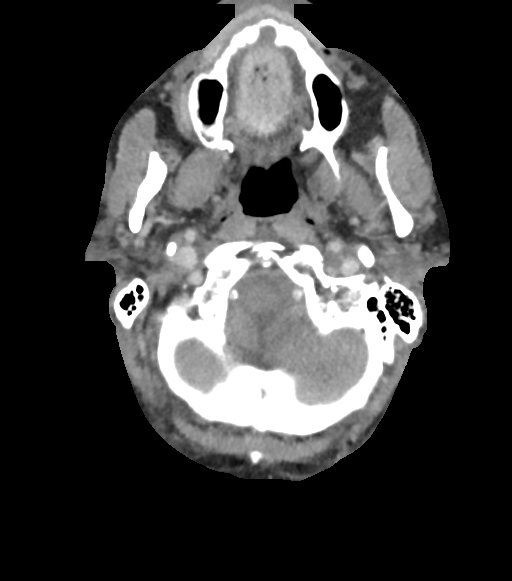
[im 138/161  bone]
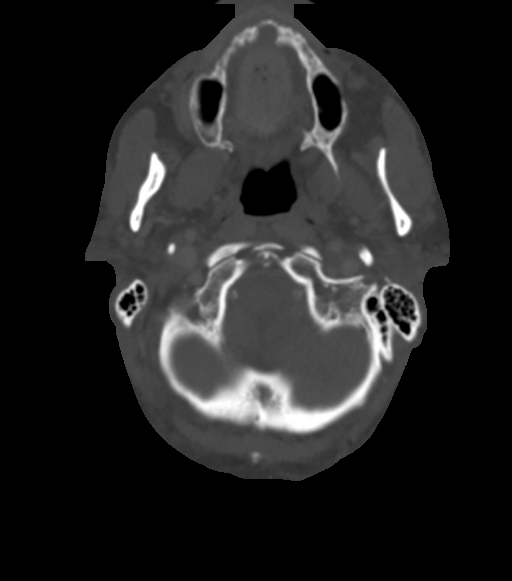

[13 of 33 positions shown; findings below may reference images not displayed]

RADIATION DOSE REDUCTION: This exam was performed according to the
departmental dose-optimization program which includes automated
exposure control, adjustment of the mA and/or kV according to
patient size and/or use of iterative reconstruction technique.

Creatinine was obtained on site at [REDACTED].

Results: Creatinine 1.9 mg/dL.

CONTRAST:  50mL [id number] IOPAMIDOL ([id number]-SKK) INJECTION 61%
FINDINGS: Pharynx and larynx: No evidence of mass or inflammation

Salivary glands: No acute inflammation or ductal obstruction. Dental
hardware obscures the anterior floor of mouth and distal
submandibular/sublingual ducts.

Two nodules or bilobed mass in the left parotid tail, the larger and
more inferior measuring 2 cm and the more superior measuring 16 mm.
Both show homogeneous enhancement. No contralateral nodule seen. The
appearance is more suggestive of parotid neoplasm than adenopathy.

Thyroid: Normal.

Lymph nodes: None enlarged or abnormal density.

Vascular: Atheromatous calcification which is generalized.

Limited intracranial: Negative

Visualized orbits: Bilateral cataract resection

Mastoids and visualized paranasal sinuses: Clear

Skeleton: Generalized cervical spine degeneration. No acute or
aggressive finding.

Upper chest: Pacer leads from the left.  Clear apical lungs.
IMPRESSION: 1. No visible sialadenitis. Negative for calculi or ductal
obstruction. A small portion of the submandibular ducts are obscured
at the floor of mouth due to dental artifact.
2. Adjacent or lobulated mass/es in the left parotid tail,
individually measuring up to 2 cm, consistent with parotid neoplasm.

## 2022-06-02 ENCOUNTER — Other Ambulatory Visit: Payer: Self-pay | Admitting: Unknown Physician Specialty

## 2022-06-02 DIAGNOSIS — R1310 Dysphagia, unspecified: Secondary | ICD-10-CM

## 2022-06-08 ENCOUNTER — Other Ambulatory Visit: Payer: Medicare Other

## 2022-06-13 ENCOUNTER — Ambulatory Visit
Admission: RE | Admit: 2022-06-13 | Discharge: 2022-06-13 | Disposition: A | Discharge status: Home / Self Care | Payer: Medicare Other | Source: Ambulatory Visit | Attending: Unknown Physician Specialty | Admitting: Unknown Physician Specialty

## 2022-06-13 DIAGNOSIS — R1310 Dysphagia, unspecified: Secondary | ICD-10-CM

## 2022-06-24 IMAGING — US IR US GUIDANCE
1 series · 6 of 6 positions shown · non-contrast
Comparison: none

INDICATION: Left parotid mass

[Series 1: ir us guidance · 0.06mm/px · 6 of 6 slices shown]
[im 1/6]
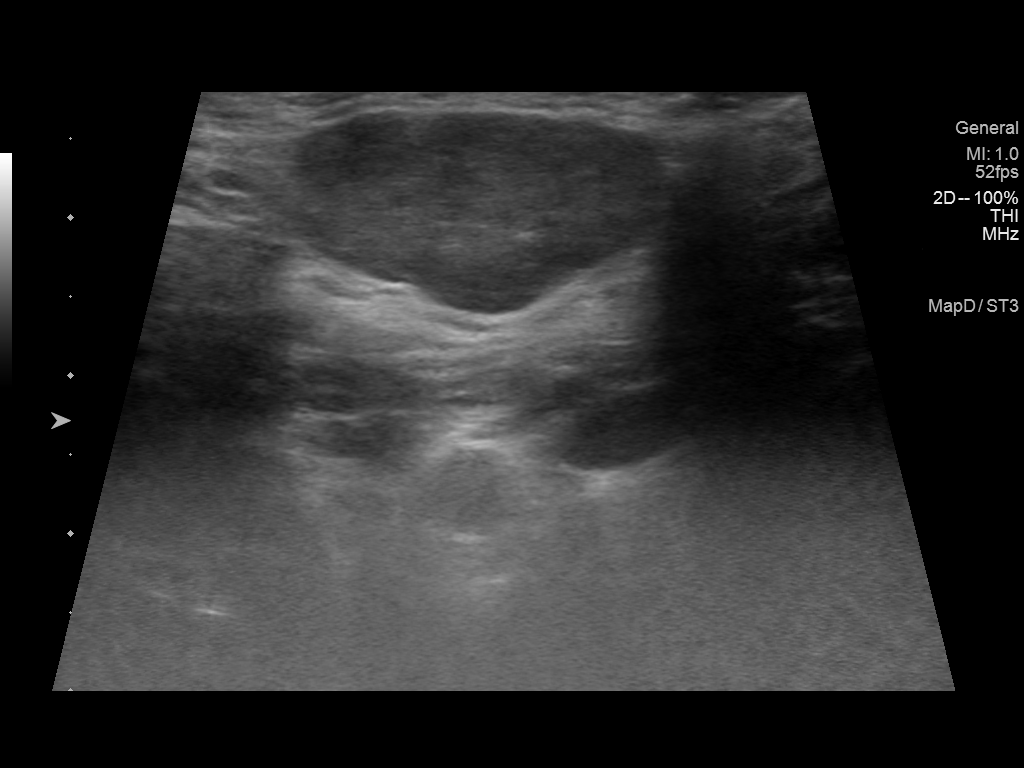
[im 2/6]
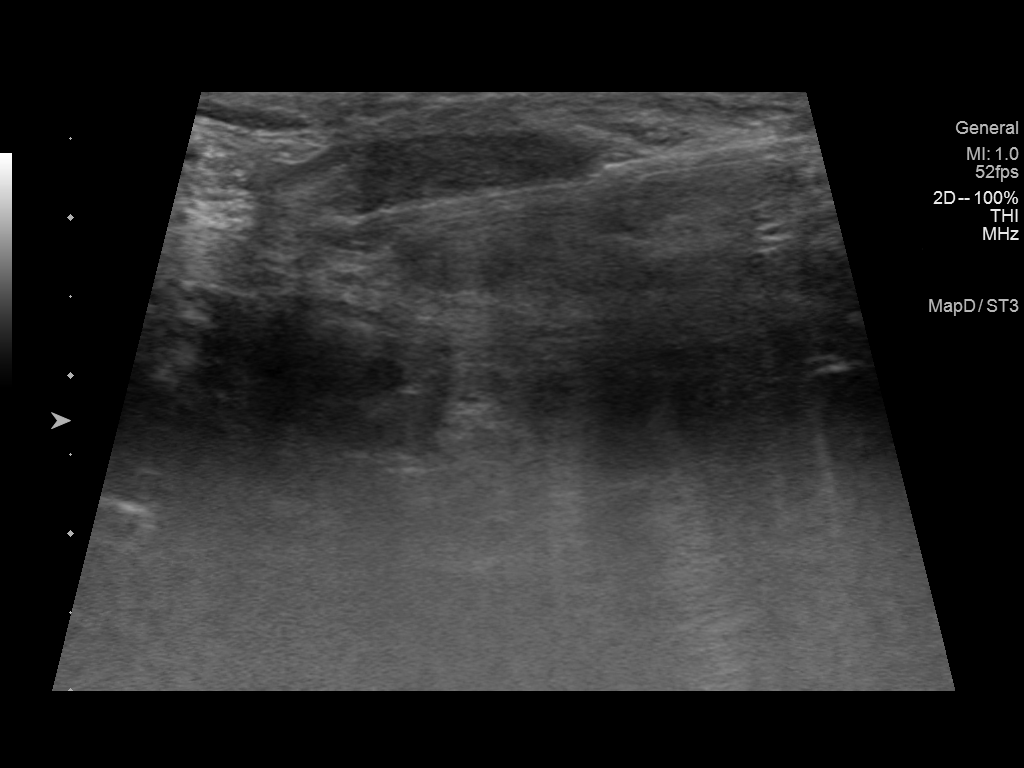
[im 3/6]
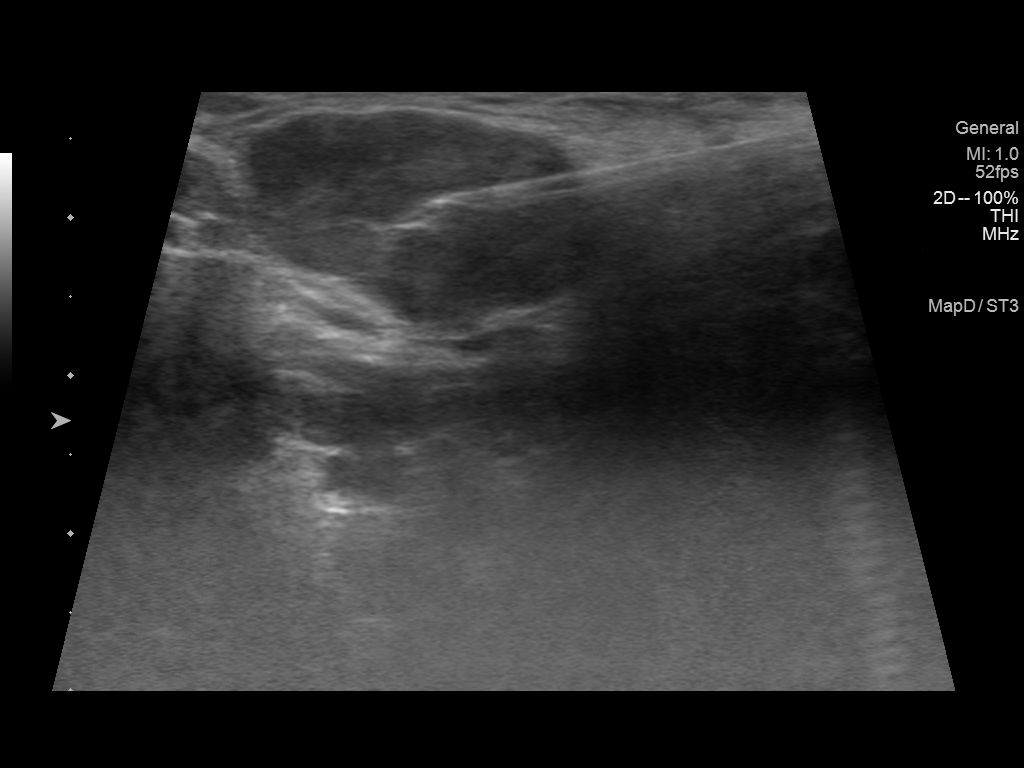
[im 4/6]
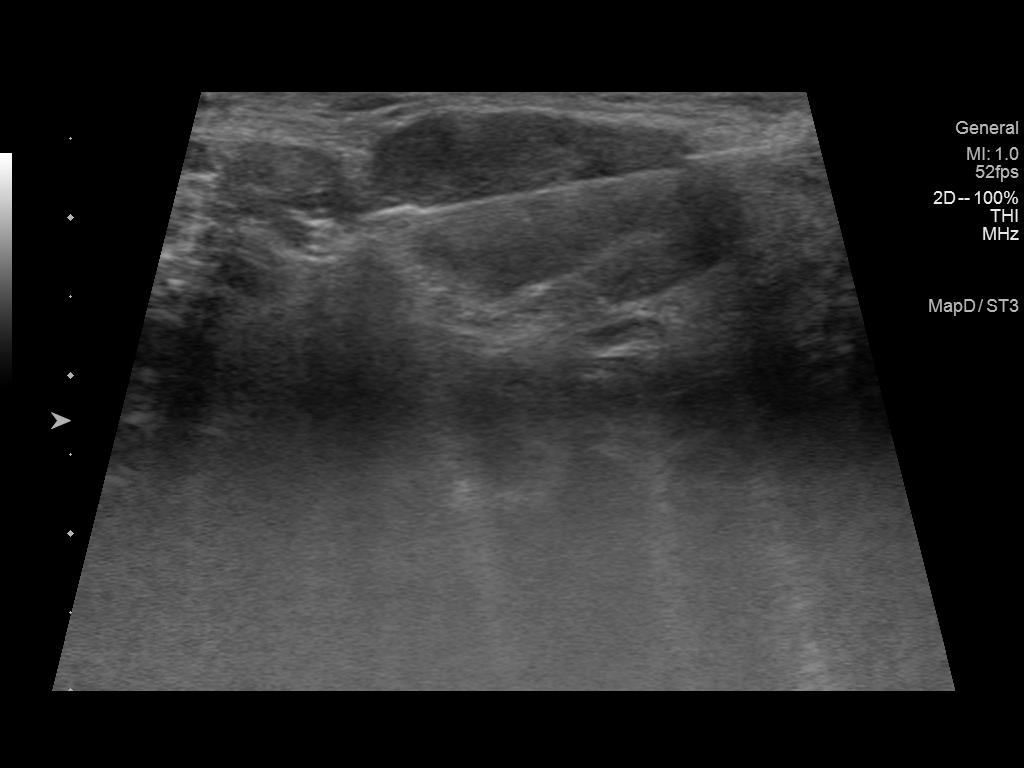
[im 5/6]
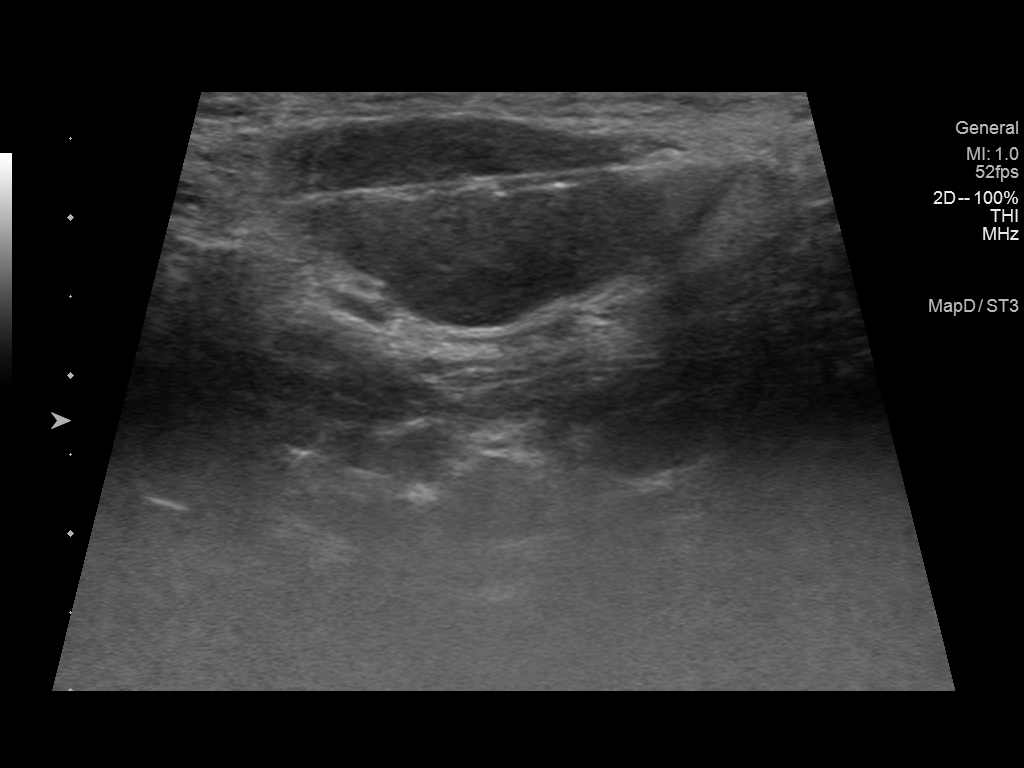
[im 6/6]
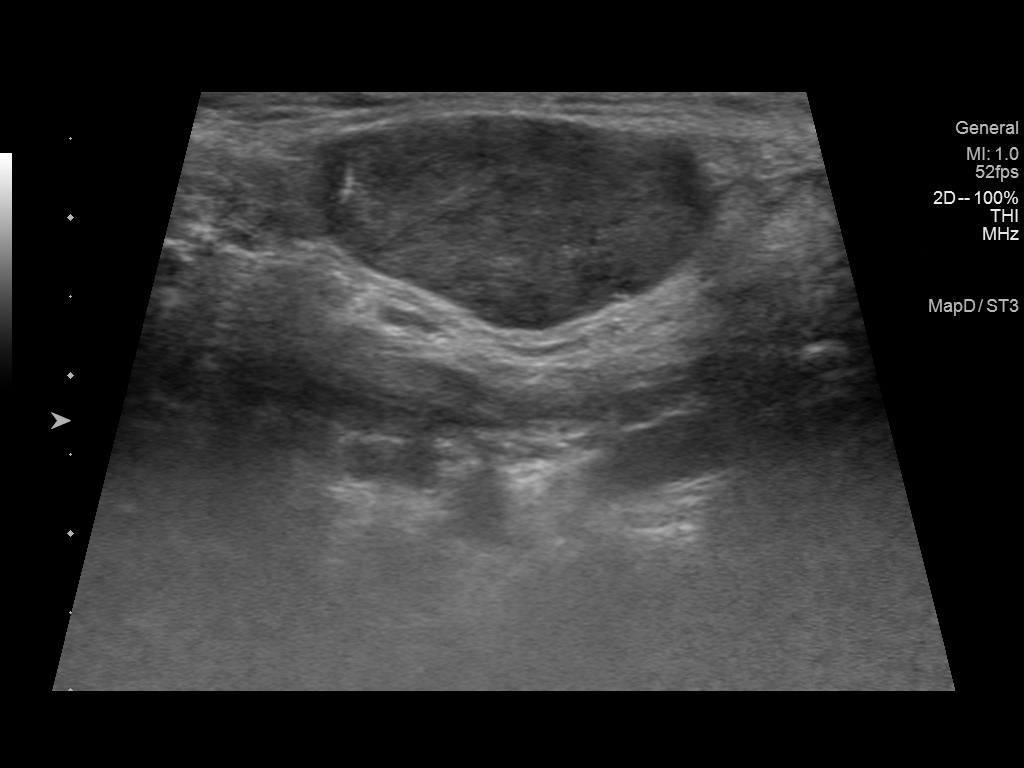

[6 of 6 positions shown; findings below may reference images not displayed]

EXAM:
Ultrasound-guided core needle biopsy of left parotid mass

MEDICATIONS:
None.

ANESTHESIA/SEDATION:
Local analgesia

FLUOROSCOPY TIME:  N/a

COMPLICATIONS:
None immediate.

PROCEDURE:
Informed written consent was obtained from the patient after a
thorough discussion of the procedural risks, benefits and
alternatives. All questions were addressed. Maximal Sterile Barrier
Technique was utilized including caps, mask, sterile gowns, sterile
gloves, sterile drape, hand hygiene and skin antiseptic. A timeout
was performed prior to the initiation of the procedure.

The patient was placed supine on the exam table. Limited ultrasound
of the left parotid gland was performed, again demonstrating a
lobulated hypoechoic mass. Skin entry site was marked, and the
overlying skin was prepped and draped in the standard sterile
fashion. Local analgesia was obtained with 1% lidocaine. A small
dermatotomy was made. Subsequently using ultrasound guidance, core
needle biopsy was performed of the identified hypoechoic mass in the
left parotid gland using an 18 gauge core biopsy device x3 passes.
Adequate specimen tissue was obtained and submitted in formalin to
pathology for further handling. Limited postprocedure imaging
demonstrated no hematoma. A clean dressing was placed after manual
hemostasis. The patient tolerated the procedure well without
immediate complication.
IMPRESSION: Successful ultrasound-guided core needle biopsy of left parotid
mass.

## 2022-11-17 ENCOUNTER — Emergency Department: Payer: Medicare Other

## 2022-11-17 ENCOUNTER — Emergency Department
Admission: EM | Admit: 2022-11-17 | Discharge: 2022-11-17 | Disposition: A | Discharge status: Home / Self Care | Payer: Medicare Other | Attending: Emergency Medicine | Admitting: Emergency Medicine

## 2022-11-17 DIAGNOSIS — I4891 Unspecified atrial fibrillation: Secondary | ICD-10-CM | POA: Diagnosis not present

## 2022-11-17 DIAGNOSIS — Z7901 Long term (current) use of anticoagulants: Secondary | ICD-10-CM | POA: Insufficient documentation

## 2022-11-17 DIAGNOSIS — I6782 Cerebral ischemia: Secondary | ICD-10-CM | POA: Insufficient documentation

## 2022-11-17 DIAGNOSIS — W1830XA Fall on same level, unspecified, initial encounter: Secondary | ICD-10-CM | POA: Diagnosis not present

## 2022-11-17 DIAGNOSIS — Z23 Encounter for immunization: Secondary | ICD-10-CM | POA: Insufficient documentation

## 2022-11-17 DIAGNOSIS — S61216A Laceration without foreign body of right little finger without damage to nail, initial encounter: Secondary | ICD-10-CM | POA: Insufficient documentation

## 2022-11-17 DIAGNOSIS — E119 Type 2 diabetes mellitus without complications: Secondary | ICD-10-CM | POA: Insufficient documentation

## 2022-11-17 DIAGNOSIS — S62339A Displaced fracture of neck of unspecified metacarpal bone, initial encounter for closed fracture: Secondary | ICD-10-CM

## 2022-11-17 MED ORDER — TETANUS-DIPHTH-ACELL PERTUSSIS 5-2.5-18.5 LF-MCG/0.5 IM SUSY
0.5000 mL | PREFILLED_SYRINGE | Freq: Once | INTRAMUSCULAR | Status: AC
  Administered 2022-11-17: 0.5 mL via INTRAMUSCULAR
  Filled 2022-11-17: qty 0.5

## 2022-11-17 MED ORDER — TRAMADOL HCL 50 MG PO TABS
50.0000 mg | ORAL_TABLET | Freq: Once | ORAL | Status: AC
  Administered 2022-11-17: 50 mg via ORAL
  Filled 2022-11-17: qty 1

## 2022-11-17 NOTE — Discharge Instructions (Addendum)
Please call and schedule follow-up appointment with orthopedics.  Return to the emergency department for any symptom that changes, worsens, or for new concerns if unable to see primary care.

## 2022-11-17 NOTE — ED Triage Notes (Signed)
Mechanical fall when he and his wife were walking in the parking lot; His wife fell on top of him; Takes Eliquis, denies head injury and no signs of trauma noted to head; Reports RIGHT pinky injury, RIGHT elbow injury, and RIGHT lower leg pain from the fall

## 2022-11-17 NOTE — ED Notes (Signed)
ED Provider at bedside. 

## 2022-11-17 NOTE — ED Provider Notes (Cosign Needed)
[facility] Provider Note    Event Date/Time   First MD Initiated Contact with Patient 11/17/22 2103     (approximate)   History   Fall (Mechanical fall when he and his wife were walking in the parking lot; His wife fell on top of him; Takes Eliquis, denies head injury and no signs of trauma noted to head; Reports RIGHT pinky injury, RIGHT elbow injury, and RIGHT lower leg pain from the fall)   HPI  Lance Diaz is a 88 y.o. male with history of A-fib on Eliquis, type 2 diabetes, and as listed in the EMR presents to the emergency department for treatment and evaluation after a mechanical, nonsyncopal fall.  He was attempting to help his wife get inside the funeral home and she became weak.  She sat down on the seat of her rollator and they both lost her balance.  He fell on top of his wife.  He is complaining of right hand pain.  He also has an abrasion to the right elbow and some tenderness over the right lower extremity.  He denies striking his head.  He denies neck pain.     Physical Exam   Triage Vital Signs: ED Triage Vitals  Enc Vitals Group     BP 11/17/22 1951 (!) 144/72     Pulse Rate 11/17/22 1951 61     Resp 11/17/22 1951 17     Temp 11/17/22 1951 98 F (36.7 C)     Temp Source 11/17/22 1951 Oral     SpO2 11/17/22 1951 99 %     Weight 11/17/22 1949 171 lb 1.2 oz (77.6 kg)     Height 11/17/22 1949 '6\' 2"'$  (1.88 m)     Head Circumference --      Peak Flow --      Pain Score 11/17/22 1951 10     Pain Loc --      Pain Edu? --      Excl. in [place]? --     Most recent vital signs: Vitals:   11/17/22 1951  BP: (!) 144/72  Pulse: 61  Resp: 17  Temp: 98 F (36.7 C)  SpO2: 99%     General: Awake, no distress.  CV:  Good peripheral perfusion.  Resp:  Normal effort.  Abd:  No distention.  Other:     ED Results / Procedures / Treatments   Labs (all labs ordered are listed, but only abnormal results are displayed) Labs Reviewed - No  data to display   EKG  Not indicated.   RADIOLOGY  CT of head is negative for acute findings.  X-ray image of the right hand shows a mildly displaced fracture at the base of the fifth metacarpal.  PROCEDURES:  Critical Care performed: No  ..Laceration Repair  Date/Time: 11/17/2022 10:45 PM  Performed by: Victorino Dike, FNP Authorized by: Victorino Dike, FNP   Consent:    Consent obtained:  Verbal   Consent given by:  Parent   Risks discussed:  Infection and poor cosmetic result Universal protocol:    Patient identity confirmed:  Verbally with patient Anesthesia:    Anesthesia method:  None Laceration details:    Location:  Finger   Finger location:  R small finger   Length (cm):  0.5 Exploration:    Imaging obtained: x-ray   Treatment:    Area cleansed with:  Chlorhexidine and saline   Irrigation method:  Tap Skin repair:  Repair method:  Tissue adhesive Approximation:    Approximation:  Loose Repair type:    Repair type:  Simple Post-procedure details:    Dressing:  Sterile dressing   Procedure completion:  Tolerated well, no immediate complications .Ortho Injury Treatment  Date/Time: 11/17/2022 10:48 PM  Performed by: Victorino Dike, FNP Authorized by: Victorino Dike, FNP   Consent:    Consent obtained:  Verbal   Consent given by:  ParentInjury location: hand Location details: right hand Injury type: fracture Fracture type: fifth metacarpal Pre-procedure neurovascular assessment: neurovascularly intact Pre-procedure distal perfusion: normal Pre-procedure neurological function: normal Pre-procedure range of motion: reduced Manipulation performed: no Immobilization: splint Splint type: Boxer. Splint Applied by: ED Tech Supplies used: cotton padding, elastic bandage and Ortho-Glass Post-procedure neurovascular assessment: post-procedure neurovascularly intact Post-procedure distal perfusion: normal Post-procedure neurological  function: normal Post-procedure range of motion: unchanged      MEDICATIONS ORDERED IN ED: Medications  Tdap (BOOSTRIX) injection 0.5 mL (has no administration in time range)  traMADol (ULTRAM) tablet 50 mg (50 mg Oral Given 11/17/22 2207)     IMPRESSION / MDM / [place] / ED COURSE  I reviewed the triage vital signs and the nursing notes.                              Differential diagnosis includes, but is not limited to, ICH, skin tear, hand fracture, right lower extremity fracture.  Patient's presentation is most consistent with acute complicated illness / injury requiring diagnostic workup.   88 year old male presenting to the emergency department for treatment and evaluation after mechanical, nonsyncopal fall.  See HPI for further details.  On exam, he has a skin tear over the right elbow.  Bleeding well-controlled.  He has swelling and tenderness over the right fifth finger with laceration overlying the DIP.  He has some tenderness over the right pretibial area without any contusions or deformities.  Laceration repaired as described above.  Boxer splint applied  Patient ambulatory without assistance in the emergency department.  Steady gait.  He will be advised to take Tylenol every 6 hours if needed for pain.  He is to call and schedule follow-up appointment with Ortho.  He is to return to the emergency department for symptoms of change or worsening of his unable to see orthopedics or primary care.Marland Kitchen    FINAL CLINICAL IMPRESSION(S) / ED DIAGNOSES   Final diagnoses:  Laceration of right little finger without foreign body without damage to nail, initial encounter  Closed boxer's fracture, initial encounter     Rx / DC Orders   ED Discharge Orders     None        Note:  This document was prepared using Dragon voice recognition software and may include unintentional dictation errors.   Victorino Dike, FNP 11/17/22 2250

## 2023-03-10 ENCOUNTER — Emergency Department
Admission: EM | Admit: 2023-03-10 | Discharge: 2023-03-10 | Disposition: A | Discharge status: Home / Self Care | Payer: Medicare Other | Attending: Emergency Medicine | Admitting: Emergency Medicine

## 2023-03-10 ENCOUNTER — Other Ambulatory Visit: Payer: Self-pay

## 2023-03-10 ENCOUNTER — Emergency Department: Payer: Medicare Other

## 2023-03-10 DIAGNOSIS — R519 Headache, unspecified: Secondary | ICD-10-CM | POA: Insufficient documentation

## 2023-03-10 DIAGNOSIS — M47812 Spondylosis without myelopathy or radiculopathy, cervical region: Secondary | ICD-10-CM | POA: Diagnosis not present

## 2023-03-10 DIAGNOSIS — H9201 Otalgia, right ear: Secondary | ICD-10-CM | POA: Diagnosis present

## 2023-03-10 DIAGNOSIS — H6591 Unspecified nonsuppurative otitis media, right ear: Secondary | ICD-10-CM

## 2023-03-10 LAB — CBC WITH DIFFERENTIAL/PLATELET
Abs Immature Granulocytes: 0.02 10*3/uL (ref 0.00–0.07)
Basophils Absolute: 0 10*3/uL (ref 0.0–0.1)
Basophils Relative: 0 %
Eosinophils Absolute: 0.1 10*3/uL (ref 0.0–0.5)
Eosinophils Relative: 1 %
HCT: 31.7 % — ABNORMAL LOW (ref 39.0–52.0)
Hemoglobin: 10.3 g/dL — ABNORMAL LOW (ref 13.0–17.0)
Immature Granulocytes: 0 %
Lymphocytes Relative: 17 %
Lymphs Abs: 1.3 10*3/uL (ref 0.7–4.0)
MCH: 32 pg (ref 26.0–34.0)
MCHC: 32.5 g/dL (ref 30.0–36.0)
MCV: 98.4 fL (ref 80.0–100.0)
Monocytes Absolute: 0.6 10*3/uL (ref 0.1–1.0)
Monocytes Relative: 7 %
Neutro Abs: 6 10*3/uL (ref 1.7–7.7)
Neutrophils Relative %: 75 %
Platelets: 164 10*3/uL (ref 150–400)
RBC: 3.22 MIL/uL — ABNORMAL LOW (ref 4.22–5.81)
RDW: 12.8 % (ref 11.5–15.5)
WBC: 8 10*3/uL (ref 4.0–10.5)
nRBC: 0 % (ref 0.0–0.2)

## 2023-03-10 LAB — COMPREHENSIVE METABOLIC PANEL
ALT: 14 U/L (ref 0–44)
AST: 16 U/L (ref 15–41)
Albumin: 3.4 g/dL — ABNORMAL LOW (ref 3.5–5.0)
Alkaline Phosphatase: 56 U/L (ref 38–126)
Anion gap: 10 (ref 5–15)
BUN: 22 mg/dL (ref 8–23)
CO2: 23 mmol/L (ref 22–32)
Calcium: 9.1 mg/dL (ref 8.9–10.3)
Chloride: 108 mmol/L (ref 98–111)
Creatinine, Ser: 1.48 mg/dL — ABNORMAL HIGH (ref 0.61–1.24)
GFR, Estimated: 45 mL/min — ABNORMAL LOW (ref >60)
Glucose, Bld: 100 mg/dL — ABNORMAL HIGH (ref 70–99)
Potassium: 4 mmol/L (ref 3.5–5.1)
Sodium: 141 mmol/L (ref 135–145)
Total Bilirubin: 0.7 mg/dL (ref 0.3–1.2)
Total Protein: 7.3 g/dL (ref 6.5–8.1)

## 2023-03-10 MED ORDER — OXYCODONE-ACETAMINOPHEN 5-325 MG PO TABS: 1.0000 | ORAL_TABLET | ORAL | 0 refills | Status: AC | PRN

## 2023-03-10 MED ORDER — ACETAMINOPHEN 500 MG PO TABS
500.0000 mg | ORAL_TABLET | Freq: Once | ORAL | Status: AC
  Administered 2023-03-10: 500 mg via ORAL
  Filled 2023-03-10: qty 1

## 2023-03-10 MED ORDER — IOHEXOL 300 MG/ML  SOLN
60.0000 mL | Freq: Once | INTRAMUSCULAR | Status: AC | PRN
  Administered 2023-03-10: 60 mL via INTRAVENOUS

## 2023-03-10 NOTE — ED Triage Notes (Signed)
Pt to ED with daughter for R ear pain and R side HA. Pt started with R ear pain 2 weeks ago, tried OTC drops with lidocaine then stopped because caused itching, then saw PCP who prescribed flonase and zyrtec (started on Sunday, used 2-3 days). Then started Z pac on Wednesday, no relief. Woke up this morning with R side throbbing HA 6/10. Pt is HOH.  Took tylenol this AM (2 tab).

## 2023-03-10 NOTE — ED Provider Notes (Signed)
[facility] Provider Note    Event Date/Time   First MD Initiated Contact with Patient 03/10/23 1249     (approximate)   History   Otalgia   HPI  Lance Diaz is a 89 y.o. male presents to the ED with daughter with complaint of right ear pain and headache.  Patient states he has had ear pain and decreased hearing for approximately 2 weeks and has been seen by his PCP who ordered initially a steroid nasal spray and Zyrtec for allergies and when this did not work a Z-Pak was ordered which patient is on his fourth day without any improvement.  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 Jenne Campus and daughter states this has been aspirated by needle in the past.     Physical Exam   Triage Vital Signs: ED Triage Vitals  Enc Vitals Group     BP 03/10/23 1216 137/72     Pulse Rate 03/10/23 1216 81     Resp 03/10/23 1216 16     Temp 03/10/23 1216 98.4 F (36.9 C)     Temp Source 03/10/23 1216 Oral     SpO2 03/10/23 1216 100 %     Weight 03/10/23 1217 165 lb (74.8 kg)     Height 03/10/23 1217 6\' 2"  (1.88 m)     Head Circumference --      Peak Flow --      Pain Score 03/10/23 1216 6     Pain Loc --      Pain Edu? --      Excl. in [place]? --     Most recent vital signs: Vitals:   03/10/23 1216  BP: 137/72  Pulse: 81  Resp: 16  Temp: 98.4 F (36.9 C)  SpO2: 100%     General: Awake, no distress.  CV:  Good peripheral perfusion.  Resp:  Normal effort.  Abd:  No distention.  Other:  Left EAC clear, left TM dull but no erythema or injection is noted.  Right EAC is partially occluded with cerumen and there appears to be an abrasion to the EAC at approximately 7 o'clock position either from eardrops that patient used or Q-tip.  TM is not visible.   ED Results / Procedures / Treatments   Labs (all labs ordered are listed, but only abnormal results are displayed) Labs Reviewed  CBC WITH  DIFFERENTIAL/PLATELET - Abnormal; Notable for the following components:      Result Value   RBC 3.22 (*)    Hemoglobin 10.3 (*)    HCT 31.7 (*)    All other components within normal limits  COMPREHENSIVE METABOLIC PANEL - Abnormal; Notable for the following components:   Glucose, Bld 100 (*)    Creatinine, Ser 1.48 (*)    Albumin 3.4 (*)    GFR, Estimated 45 (*)    All other components within normal limits      RADIOLOGY Ct pending    PROCEDURES:  Critical Care performed:   Procedures   MEDICATIONS ORDERED IN ED: Medications  iohexol (OMNIPAQUE) 300 MG/ML solution 60 mL (has no administration in time range)  acetaminophen (TYLENOL) tablet 500 mg (500 mg Oral Given 03/10/23 1515)     IMPRESSION / MDM / ASSESSMENT AND PLAN / ED COURSE  I reviewed the triage vital signs and the nursing notes.   Differential diagnosis includes, but is not limited to, right ear pain, eustachian tube dysfunction,sialadenitis,  right parotid gland mass, cerumen impaction.  ----------------------------------------- 3:43 PM on 03/10/2023 ----------------------------------------- The remainder of patient's care is being turned over to Dr. Vicente Males who is my attending today.  Patient currently is waiting for CT scan for evaluation of his right ear pain and right-sided headache.      Patient's presentation is most consistent with acute illness / injury with system symptoms.  FINAL CLINICAL IMPRESSION(S) / ED DIAGNOSES   Final diagnoses:  Right ear pain     Rx / DC Orders   ED Discharge Orders     None        Note:  This document was prepared using Dragon voice recognition software and may include unintentional dictation errors.   Tommi Rumps, PA-C 03/10/23 1545    Merwyn Katos, MD 03/10/23 1910

## 2023-03-10 NOTE — ED Notes (Signed)
Patient declined discharge vital signs. 

## 2024-09-16 ENCOUNTER — Emergency Department

## 2024-09-16 ENCOUNTER — Inpatient Hospital Stay
Admission: EM | Admit: 2024-09-16 | Discharge: 2024-09-22 | DRG: 199 | Disposition: A | Discharge status: Skilled Nursing Facility | Attending: Internal Medicine | Admitting: Internal Medicine

## 2024-09-16 ENCOUNTER — Other Ambulatory Visit: Payer: Self-pay

## 2024-09-16 DIAGNOSIS — Z8249 Family history of ischemic heart disease and other diseases of the circulatory system: Secondary | ICD-10-CM

## 2024-09-16 DIAGNOSIS — D649 Anemia, unspecified: Secondary | ICD-10-CM | POA: Diagnosis present

## 2024-09-16 DIAGNOSIS — J189 Pneumonia, unspecified organism: Secondary | ICD-10-CM | POA: Diagnosis present

## 2024-09-16 DIAGNOSIS — S2242XA Multiple fractures of ribs, left side, initial encounter for closed fracture: Secondary | ICD-10-CM | POA: Diagnosis not present

## 2024-09-16 DIAGNOSIS — E1121 Type 2 diabetes mellitus with diabetic nephropathy: Secondary | ICD-10-CM | POA: Diagnosis present

## 2024-09-16 DIAGNOSIS — W19XXXA Unspecified fall, initial encounter: Secondary | ICD-10-CM | POA: Diagnosis not present

## 2024-09-16 DIAGNOSIS — D519 Vitamin B12 deficiency anemia, unspecified: Secondary | ICD-10-CM | POA: Diagnosis present

## 2024-09-16 DIAGNOSIS — S060X0A Concussion without loss of consciousness, initial encounter: Secondary | ICD-10-CM | POA: Diagnosis present

## 2024-09-16 DIAGNOSIS — M4854XA Collapsed vertebra, not elsewhere classified, thoracic region, initial encounter for fracture: Secondary | ICD-10-CM | POA: Diagnosis present

## 2024-09-16 DIAGNOSIS — Z9109 Other allergy status, other than to drugs and biological substances: Secondary | ICD-10-CM

## 2024-09-16 DIAGNOSIS — N400 Enlarged prostate without lower urinary tract symptoms: Secondary | ICD-10-CM | POA: Diagnosis present

## 2024-09-16 DIAGNOSIS — F0390 Unspecified dementia without behavioral disturbance: Secondary | ICD-10-CM | POA: Diagnosis present

## 2024-09-16 DIAGNOSIS — I129 Hypertensive chronic kidney disease with stage 1 through stage 4 chronic kidney disease, or unspecified chronic kidney disease: Secondary | ICD-10-CM | POA: Diagnosis present

## 2024-09-16 DIAGNOSIS — G9341 Metabolic encephalopathy: Secondary | ICD-10-CM | POA: Diagnosis present

## 2024-09-16 DIAGNOSIS — I10 Essential (primary) hypertension: Secondary | ICD-10-CM | POA: Diagnosis not present

## 2024-09-16 DIAGNOSIS — Z23 Encounter for immunization: Secondary | ICD-10-CM

## 2024-09-16 DIAGNOSIS — Z79899 Other long term (current) drug therapy: Secondary | ICD-10-CM

## 2024-09-16 DIAGNOSIS — Z8042 Family history of malignant neoplasm of prostate: Secondary | ICD-10-CM

## 2024-09-16 DIAGNOSIS — Z888 Allergy status to other drugs, medicaments and biological substances status: Secondary | ICD-10-CM

## 2024-09-16 DIAGNOSIS — J939 Pneumothorax, unspecified: Secondary | ICD-10-CM | POA: Diagnosis not present

## 2024-09-16 DIAGNOSIS — E1151 Type 2 diabetes mellitus with diabetic peripheral angiopathy without gangrene: Secondary | ICD-10-CM | POA: Diagnosis present

## 2024-09-16 DIAGNOSIS — E1122 Type 2 diabetes mellitus with diabetic chronic kidney disease: Secondary | ICD-10-CM | POA: Diagnosis present

## 2024-09-16 DIAGNOSIS — D696 Thrombocytopenia, unspecified: Secondary | ICD-10-CM | POA: Diagnosis present

## 2024-09-16 DIAGNOSIS — D72829 Elevated white blood cell count, unspecified: Secondary | ICD-10-CM | POA: Diagnosis present

## 2024-09-16 DIAGNOSIS — I480 Paroxysmal atrial fibrillation: Secondary | ICD-10-CM | POA: Diagnosis present

## 2024-09-16 DIAGNOSIS — N1831 Chronic kidney disease, stage 3a: Secondary | ICD-10-CM | POA: Diagnosis present

## 2024-09-16 DIAGNOSIS — S270XXA Traumatic pneumothorax, initial encounter: Principal | ICD-10-CM | POA: Diagnosis present

## 2024-09-16 DIAGNOSIS — Y92009 Unspecified place in unspecified non-institutional (private) residence as the place of occurrence of the external cause: Secondary | ICD-10-CM

## 2024-09-16 DIAGNOSIS — S61411A Laceration without foreign body of right hand, initial encounter: Secondary | ICD-10-CM | POA: Diagnosis present

## 2024-09-16 DIAGNOSIS — Z950 Presence of cardiac pacemaker: Secondary | ICD-10-CM

## 2024-09-16 DIAGNOSIS — E7800 Pure hypercholesterolemia, unspecified: Secondary | ICD-10-CM | POA: Diagnosis present

## 2024-09-16 DIAGNOSIS — E785 Hyperlipidemia, unspecified: Secondary | ICD-10-CM | POA: Diagnosis present

## 2024-09-16 DIAGNOSIS — M25512 Pain in left shoulder: Secondary | ICD-10-CM | POA: Diagnosis present

## 2024-09-16 DIAGNOSIS — Z66 Do not resuscitate: Secondary | ICD-10-CM | POA: Diagnosis present

## 2024-09-16 DIAGNOSIS — Z7901 Long term (current) use of anticoagulants: Secondary | ICD-10-CM

## 2024-09-16 DIAGNOSIS — Z8419 Family history of other disorders of kidney and ureter: Secondary | ICD-10-CM

## 2024-09-16 DIAGNOSIS — S2232XA Fracture of one rib, left side, initial encounter for closed fracture: Secondary | ICD-10-CM | POA: Diagnosis present

## 2024-09-16 DIAGNOSIS — Z87891 Personal history of nicotine dependence: Secondary | ICD-10-CM

## 2024-09-16 DIAGNOSIS — R410 Disorientation, unspecified: Secondary | ICD-10-CM | POA: Diagnosis present

## 2024-09-16 DIAGNOSIS — F05 Delirium due to known physiological condition: Secondary | ICD-10-CM | POA: Diagnosis present

## 2024-09-16 DIAGNOSIS — Z7984 Long term (current) use of oral hypoglycemic drugs: Secondary | ICD-10-CM

## 2024-09-16 LAB — CBC
HCT: 32.4 % — ABNORMAL LOW (ref 39.0–52.0)
Hemoglobin: 10.7 g/dL — ABNORMAL LOW (ref 13.0–17.0)
MCH: 33 pg (ref 26.0–34.0)
MCHC: 33 g/dL (ref 30.0–36.0)
MCV: 100 fL (ref 80.0–100.0)
Platelets: 125 K/uL — ABNORMAL LOW (ref 150–400)
RBC: 3.24 MIL/uL — ABNORMAL LOW (ref 4.22–5.81)
RDW: 11.9 % (ref 11.5–15.5)
WBC: 12.3 K/uL — ABNORMAL HIGH (ref 4.0–10.5)
nRBC: 0 % (ref 0.0–0.2)

## 2024-09-16 LAB — URINALYSIS, ROUTINE W REFLEX MICROSCOPIC
Bilirubin Urine: NEGATIVE
Glucose, UA: NEGATIVE mg/dL
Hgb urine dipstick: NEGATIVE
Ketones, ur: NEGATIVE mg/dL
Leukocytes,Ua: NEGATIVE
Nitrite: NEGATIVE
Protein, ur: NEGATIVE mg/dL
Specific Gravity, Urine: 1.021 (ref 1.005–1.030)
pH: 5 (ref 5.0–8.0)

## 2024-09-16 LAB — HEPATIC FUNCTION PANEL
ALT: 27 U/L (ref 0–44)
AST: 25 U/L (ref 15–41)
Albumin: 4 g/dL (ref 3.5–5.0)
Alkaline Phosphatase: 47 U/L (ref 38–126)
Bilirubin, Direct: 0.1 mg/dL (ref 0.0–0.2)
Indirect Bilirubin: 0.6 mg/dL (ref 0.3–0.9)
Total Bilirubin: 0.7 mg/dL (ref 0.0–1.2)
Total Protein: 7.4 g/dL (ref 6.5–8.1)

## 2024-09-16 LAB — TECHNOLOGIST SMEAR REVIEW

## 2024-09-16 LAB — TROPONIN I (HIGH SENSITIVITY): Troponin I (High Sensitivity): 13 ng/L (ref <18)

## 2024-09-16 LAB — BASIC METABOLIC PANEL WITH GFR
Anion gap: 11 (ref 5–15)
BUN: 46 mg/dL — ABNORMAL HIGH (ref 8–23)
CO2: 22 mmol/L (ref 22–32)
Calcium: 9.6 mg/dL (ref 8.9–10.3)
Chloride: 109 mmol/L (ref 98–111)
Creatinine, Ser: 1.98 mg/dL — ABNORMAL HIGH (ref 0.61–1.24)
GFR, Estimated: 31 mL/min — ABNORMAL LOW (ref >60)
Glucose, Bld: 123 mg/dL — ABNORMAL HIGH (ref 70–99)
Potassium: 4.9 mmol/L (ref 3.5–5.1)
Sodium: 142 mmol/L (ref 135–145)

## 2024-09-16 LAB — CK: Total CK: 144 U/L (ref 49–397)

## 2024-09-16 LAB — LACTATE DEHYDROGENASE: LDH: 199 U/L — ABNORMAL HIGH (ref 98–192)

## 2024-09-16 MED ORDER — HYDRALAZINE HCL 20 MG/ML IJ SOLN: 5.0000 mg | INTRAMUSCULAR | Status: DC | PRN

## 2024-09-16 MED ORDER — ACETAMINOPHEN 325 MG PO TABS
650.0000 mg | ORAL_TABLET | Freq: Four times a day (QID) | ORAL | Status: DC | PRN
  Administered 2024-09-17 – 2024-09-18 (×3): 650 mg via ORAL
  Filled 2024-09-16 (×3): qty 2

## 2024-09-16 MED ORDER — METHOCARBAMOL 500 MG PO TABS
500.0000 mg | ORAL_TABLET | Freq: Three times a day (TID) | ORAL | Status: DC | PRN
  Administered 2024-09-18 – 2024-09-21 (×5): 500 mg via ORAL
  Filled 2024-09-16 (×5): qty 1

## 2024-09-16 MED ORDER — ONDANSETRON HCL 4 MG/2ML IJ SOLN: 4.0000 mg | Freq: Three times a day (TID) | INTRAMUSCULAR | Status: DC | PRN

## 2024-09-16 MED ORDER — INSULIN ASPART 100 UNIT/ML IJ SOLN: 0.0000 [IU] | Freq: Three times a day (TID) | INTRAMUSCULAR | Status: DC

## 2024-09-16 MED ORDER — OXYCODONE-ACETAMINOPHEN 5-325 MG PO TABS
1.0000 | ORAL_TABLET | ORAL | Status: DC | PRN
Start: 2024-09-16 — End: 2024-09-17

## 2024-09-16 MED ORDER — LIDOCAINE 5 % EX PTCH
1.0000 | MEDICATED_PATCH | Freq: Every day | CUTANEOUS | Status: DC
  Administered 2024-09-17 – 2024-09-21 (×5): 1 via TRANSDERMAL
  Filled 2024-09-16 (×5): qty 1

## 2024-09-16 MED ORDER — MORPHINE SULFATE (PF) 2 MG/ML IV SOLN
0.5000 mg | INTRAVENOUS | Status: DC | PRN
  Administered 2024-09-17: 0.5 mg via INTRAVENOUS
  Filled 2024-09-16: qty 1

## 2024-09-16 MED ORDER — SODIUM CHLORIDE 0.9 % IV BOLUS
500.0000 mL | Freq: Once | INTRAVENOUS | Status: AC
  Administered 2024-09-16: 500 mL via INTRAVENOUS

## 2024-09-16 MED ORDER — INSULIN ASPART 100 UNIT/ML IJ SOLN: 0.0000 [IU] | Freq: Every day | INTRAMUSCULAR | Status: DC

## 2024-09-16 NOTE — ED Triage Notes (Signed)
 First Nurse Note: Patient to ED via [facility] from home for a fall. PT fell in the yard- unwitnessed. Laceration to right hand and skin tear to elbow. C/o bilateral rib pain and back pain. Vs WNL.

## 2024-09-16 NOTE — ED Triage Notes (Addendum)
 Pt comes via EMS from home with c/o fall. Pt states he did hit his head. Pt states chest to daughter and she was worried bc he has pacemaker. Pt has skin tear to right hand and right elbow. Pt has hematoma to back of head. Pt is on thinner. Pt also states left side pain and back pain.

## 2024-09-16 NOTE — ED Provider Notes (Incomplete)
 [facility] Provider Note    Event Date/Time   First MD Initiated Contact with Patient 09/16/24 2145     (approximate)   History   Fall   HPI  Lance Diaz is a 89 y.o. male who comes in with a concerns for a fall where he did hit his head.  Patient has a hematoma to the back of the head and is on a blood thinner. Physical Exam   Triage Vital Signs: ED Triage Vitals [09/16/24 1840]  Encounter Vitals Group     BP 136/87     Girls Systolic BP Percentile      Girls Diastolic BP Percentile      Boys Systolic BP Percentile      Boys Diastolic BP Percentile      Pulse Rate 73     Resp 18     Temp 98 F (36.7 C)     Temp src      SpO2 100 %     Weight      Height      Head Circumference      Peak Flow      Pain Score      Pain Loc      Pain Education      Exclude from Growth Chart     Most recent vital signs: Vitals:   09/16/24 1840  BP: 136/87  Pulse: 73  Resp: 18  Temp: 98 F (36.7 C)  SpO2: 100%     General: Awake, no distress.  CV:  Good peripheral perfusion.  Resp:  Normal effort.  Abd:  No distention.  Other:     ED Results / Procedures / Treatments   Labs (all labs ordered are listed, but only abnormal results are displayed) Labs Reviewed  BASIC METABOLIC PANEL WITH GFR - Abnormal; Notable for the following components:      Result Value   Glucose, Bld 123 (*)    BUN 46 (*)    Creatinine, Ser 1.98 (*)    GFR, Estimated 31 (*)    All other components within normal limits  CBC - Abnormal; Notable for the following components:   WBC 12.3 (*)    RBC 3.24 (*)    Hemoglobin 10.7 (*)    HCT 32.4 (*)    Platelets 125 (*)    All other components within normal limits  TROPONIN I (HIGH SENSITIVITY)  TROPONIN I (HIGH SENSITIVITY)     EKG  My interpretation of EKG:    RADIOLOGY I have reviewed the xray personally and interpreted    PROCEDURES:  Critical Care performed: {CriticalCareYesNo:19197::Yes, see  critical care procedure note(s),No}  Procedures   MEDICATIONS ORDERED IN ED: Medications - No data to display   IMPRESSION / MDM / ASSESSMENT AND PLAN / ED COURSE  I reviewed the triage vital signs and the nursing notes.   Patient's presentation is most consistent with {EM COPA:27473}   CT head and neck are negative.  Does show small left apical pneumothorax. Patient does have rib fractures from 7th through 10th with trace left apical pneumothorax.   The patient is on the cardiac monitor to evaluate for evidence of arrhythmia and/or significant heart rate changes.      FINAL CLINICAL IMPRESSION(S) / ED DIAGNOSES   Final diagnoses:  None     Rx / DC Orders   ED Discharge Orders     None        Note:  This document  was prepared using Conservation officer, historic buildings and may include unintentional dictation errors.

## 2024-09-16 NOTE — H&P (Signed)
 History and Physical    Lance Diaz FMW:[id number] DOB: 1931/04/12 DOA: 09/16/2024  Referring MD/NP/PA:   PCP: Cleotilde Oneil FALCON, MD   Patient coming from:  The patient is coming from home.     Chief Complaint: fall  HPI: Lance Diaz is a 89 y.o. male with medical history significant of HTN, HLD, diet-controlled diabetes, SSS (s/p of PPM), A-fib on Eliquis, GERD, BPH, CKD-3, carotid artery stenosis, who presents with fall.  Per his granddaughter at the bedside, patient fell twice today which were unwitnessed.  Patient has bruise and skin laceration to right hand and forearms.  He complains of right flank pain, left chest wall pain, which is sharp, moderate to severe, nonradiating, aggravated by movement.  Patient had shortness of breath earlier due to panics, which has resolved.  No cough, fever or chills.  No nausea, vomiting, diarrhea or abdominal pain.  No symptoms of UTI.  Per his granddaughter, patient was newly started on tramadol  2 days ago.  He seems to be confused intermittently in the past 2 days.  When I saw patient in ED, patient has delirium, but still orientated x 3.  He moves all extremities, no facial droop or slurred speech.  Data reviewed independently and ED Course: pt was found to have WBC 12.3, platelet 125, stable renal function, troponin 13, negative UA, temperature normal, blood pressure 168/81, heart rate 76, RR 18, oxygen saturation 100% on room air.  CT of head negative for acute intracranial abnormalities.  Negative x-ray for right elbow and bilateral wrist.   X-ray of L-spine: 1. No acute fracture of the lumbar spine. 2. Interval development of age indeterminate T12 compression fracture. Limited evaluation due to overlapping osseous structures and overlying soft tissues. Correlate with point tenderness to palpation to evaluate for an acute component.  X-ray of ribs/left chest: 1. Acute displaced left lateral 7th10th rib fractures. 2. Trace left  apical pneumothorax. Finding better evaluated on CT c-spine 09/16/24.  CT of C-spine: 1. No acute intracranial abnormality. 2. No traumatic injury to the cervical spine. 3. Small left apical pneumothorax.      EKG: I have personally reviewed.  A-fib, QTc 428, pace marker is not very clear   Review of Systems:   General: no fevers, chills, no body weight gain, has fatigue HEENT: no blurry vision, hearing changes or sore throat Respiratory: no dyspnea, coughing, wheezing CV: no chest pain, no palpitations GI: no nausea, vomiting, abdominal pain, diarrhea, constipation GU: no dysuria, burning on urination, increased urinary frequency, hematuria  Ext: no leg edema Neuro: no unilateral weakness, numbness, or tingling, no vision change or hearing loss. Has delirium Skin: has skin tears and bruises MSK: has left flank pain and left chest wall pain Heme: No easy bruising.  Travel history: No recent long distant travel.   Allergy:  Allergies  Allergen Reactions   Ace Inhibitors Other (See Comments)    Headaches   Iodine Swelling    Topical    Tamsulosin Hives    Past Medical History:  Diagnosis Date   Anemia    Chronic kidney disease    Diabetes mellitus without complication (HCC)    Diverticulosis    Dyspnea    with exersion   Erectile dysfunction    GERD (gastroesophageal reflux disease)    High cholesterol    Peripheral vascular disease    Presence of permanent cardiac pacemaker     Past Surgical History:  Procedure Laterality Date   cirmucision  HERNIA REPAIR Right 2014   INGUINAL HERNIA REPAIR Right 05/01/2017   Procedure: HERNIA REPAIR INGUINAL ADULT;  Surgeon: Jordis Laneta FALCON, MD;  Location: [facility];  Service: General;  Laterality: Right;   INSERTION OF MESH N/A 05/01/2017   Procedure: INSERTION OF MESH;  Surgeon: Jordis Laneta FALCON, MD;  Location: [facility];  Service: General;  Laterality: N/A;   IR US  GUIDE BX ASP/DRAIN  04/04/2022   PACEMAKER INSERTION Left  05/24/2016   Procedure: INSERTION PACEMAKER;  Surgeon: Marsa Dooms, MD;  Location: [facility];  Service: Cardiovascular;  Laterality: Left;    Social History:  reports that he quit smoking about 38 years ago. His smoking use included cigarettes. He has never used smokeless tobacco. He reports that he does not drink alcohol and does not use drugs.  Family History:  Family History  Problem Relation Age of Onset   Cancer Daughter    Cancer Father        Prostate Cancer   Renal Disease Mother    Coronary artery disease Sister      Prior to Admission medications   Medication Sig Start Date End Date Taking? Authorizing Provider  acetaminophen  (TYLENOL ) 650 MG CR tablet Take 650 mg by mouth every 8 (eight) hours as needed for pain.    [provider]  apixaban (ELIQUIS) 5 MG TABS tablet Take 5 mg by mouth 2 (two) times daily.    [provider]  clindamycin (CLEOCIN T) 1 % external solution Apply 1 application topically 2 (two) times daily as needed (rash).  Patient not taking: Reported on 12/26/2021    [provider]  diltiazem (TIAZAC) 120 MG 24 hr capsule Take 120 mg by mouth every evening.  04/10/17   [provider]  ibuprofen (ADVIL,MOTRIN) 200 MG tablet Take 1 tablet by mouth every 6 (six) hours as needed.    [provider]  Iron-Vitamin C 65-125 MG TABS Take 1 tablet by mouth daily.     [provider]  metFORMIN  (GLUCOPHAGE ) 500 MG tablet Take 500 mg by mouth 2 (two) times daily. Patient not taking: Reported on 02/22/2022 03/20/16   [provider]  pantoprazole (PROTONIX) 40 MG tablet Take 40 mg by mouth daily as needed (indigestion).  05/30/16 05/30/17  [provider]  sildenafil (VIAGRA) 50 MG tablet Take by mouth. 09/28/21   [provider]  valACYclovir (VALTREX) 1000 MG tablet Take 1,000 mg by mouth 2 (two) times daily as needed (outbreaks).  07/04/16   [provider]  vardenafil (LEVITRA)  20 MG tablet Take 20 mg by mouth daily as needed for erectile dysfunction. Patient not taking: Reported on 12/26/2021    [provider]  vitamin B-12 (CYANOCOBALAMIN) 1000 MCG tablet Take 1,000 mcg by mouth daily.    [provider]    Physical Exam: Vitals:   09/16/24 1840 09/16/24 2213 09/17/24 0000 09/17/24 0101  BP: 136/87 (!) 168/81 (!) 150/74 (!) 166/85  Pulse: 73 76  83  Resp: 18 18 (!) 23 20  Temp: 98 F (36.7 C) 97.6 F (36.4 C)  98.4 F (36.9 C)  TempSrc:    Oral  SpO2: 100% 100% 100% 100%   General: Not in acute distress HEENT:       Eyes: PERRL, EOMI, no jaundice       ENT: No discharge from the ears and nose, no pharynx injection, no tonsillar enlargement.        Neck: No JVD, no bruit, no mass  felt. Heme: No neck lymph node enlargement. Cardiac: S1/S2, RRR, No murmurs, No gallops or rubs. Respiratory: No rales, wheezing, rhonchi or rubs. GI: Soft, nondistended, nontender, no rebound pain, no organomegaly, BS present. GU: No hematuria Ext: No pitting leg edema bilaterally. 1+DP/PT pulse bilaterally. Musculoskeletal: has tenderness in left flank area and left rib cage Skin: Has skin tear to right hand and bruises to forearms Neuro: has delirium and confusion, but is still oriented X3, cranial nerves II-XII grossly intact, moves all extremities Psych: Patient is not psychotic, no suicidal or hemocidal ideation.  Labs on Admission: I have personally reviewed following labs and imaging studies  CBC: Recent Labs  Lab 09/16/24 1849  WBC 12.3*  HGB 10.7*  HCT 32.4*  MCV 100.0  PLT 125*   Basic Metabolic Panel: Recent Labs  Lab 09/16/24 1849  NA 142  K 4.9  CL 109  CO2 22  GLUCOSE 123*  BUN 46*  CREATININE 1.98*  CALCIUM  9.6   GFR: CrCl cannot be calculated (Unknown ideal weight.). Liver Function Tests: Recent Labs  Lab 09/16/24 1849  AST 25  ALT 27  ALKPHOS 47  BILITOT 0.7  PROT 7.4  ALBUMIN 4.0   No results for input(s):  LIPASE, AMYLASE in the last 168 hours. No results for input(s): AMMONIA in the last 168 hours. Coagulation Profile: No results for input(s): INR, PROTIME in the last 168 hours. Cardiac Enzymes: Recent Labs  Lab 09/16/24 1849  CKTOTAL 144   BNP (last 3 results) No results for input(s): PROBNP in the last 8760 hours. HbA1C: No results for input(s): HGBA1C in the last 72 hours. CBG: No results for input(s): GLUCAP in the last 168 hours. Lipid Profile: No results for input(s): CHOL, HDL, LDLCALC, TRIG, CHOLHDL, LDLDIRECT in the last 72 hours. Thyroid Function Tests: No results for input(s): TSH, T4TOTAL, FREET4, T3FREE, THYROIDAB in the last 72 hours. Anemia Panel: No results for input(s): VITAMINB12, FOLATE, FERRITIN, TIBC, IRON, RETICCTPCT in the last 72 hours. Urine analysis:    Component Value Date/Time   COLORURINE YELLOW (A) 09/16/2024 2240   APPEARANCEUR HAZY (A) 09/16/2024 2240   LABSPEC 1.021 09/16/2024 2240   PHURINE 5.0 09/16/2024 2240   GLUCOSEU NEGATIVE 09/16/2024 2240   HGBUR NEGATIVE 09/16/2024 2240   BILIRUBINUR NEGATIVE 09/16/2024 2240   KETONESUR NEGATIVE 09/16/2024 2240   PROTEINUR NEGATIVE 09/16/2024 2240   NITRITE NEGATIVE 09/16/2024 2240   LEUKOCYTESUR NEGATIVE 09/16/2024 2240   Sepsis Labs: @LABRCNTIP (procalcitonin:4,lacticidven:4) )No results found for this or any previous visit (from the past 240 hours).   Radiological Exams on Admission:   Assessment/Plan Principal Problem:   Pneumothorax on left Active Problems:   Left rib fracture   Fall at home, initial encounter   Benign essential HTN   Paroxysmal A-fib (HCC)   HLD (hyperlipidemia)   Type 2 diabetes mellitus with diabetic nephropathy (HCC)   Chronic kidney disease, stage 3a (HCC)   Leukocytosis   Thrombocytopenia   Normocytic anemia   Delirium   Assessment and Plan:  Pneumothorax on left: pt has very small left apical  pneumothorax on X-ray and Ct of c spin. No oxygen desaturation.  ED physician discussed with Dr. Shan for pulmonology, they are okay with patient staying here and agreed with not placing any chest tube and they can get a repeat x-ray to monitor this apical pneumothorax. -will place in tele bed for obs -will repeat CXR in morning at 6:00 -pain control as below  Left rib fracture: - Pain control: As  needed morphine, Percocet, Tylenol  - Lidoderm  patch - Incentive spirometry  Fall at home, initial encounter -Fall precaution - PT/OT - Wound care consult for skin tear  Benign essential HTN -IV hydralazine as needed - DAPT use them  Paroxysmal A-fib (HCC): Heart rate 70s -Temporarily hold Eliquis due to injury - Diltiazem  HLD (hyperlipidemia) -Lipid  Diet controlled Type 2 diabetes mellitus with diabetic nephropathy (HCC): Recent A1c 5.9, well-controlled.  Patient's not taking medications currently. -Check CBG every morning  Chronic kidney disease, stage 3a (HCC): Stable renal function.  Recent baseline creatinine 2.0 on 08/29/2024.  His creatinine is 1.98, BUN 46, GFR 31 - Follow-up with BMP  Leukocytosis: WBC 12.3, no fever, likely reactive -Follow-up CBC  Thrombocytopenia: Platelet 125 which is new. -Check LDH and peripheral smear  Normocytic anemia: Hemoglobin stable 10.7 (10.5 on 9/26-25) -Followed by CBC  Delirium: Etiology is not clear.  May be related to tramadol  use. - Hold tramadol  - Frequent neurocheck    DVT ppx: SCD  Code Status: DNR per his granddaughter  Family Communication:      Yes, patient's granddaughter at bed side.    Disposition Plan:  Anticipate discharge back to previous environment  Consults called: ED physician discussed with Dr. Fernand of pulmonology  Admission status and Level of care: Telemetry Medical:    for obs     Dispo: The patient is from: Home              Anticipated d/c is to: Home              Anticipated d/c date is: 1  day              Patient currently is not medically stable to d/c.    Severity of Illness:  The appropriate patient status for this patient is OBSERVATION. Observation status is judged to be reasonable and necessary in order to provide the required intensity of service to ensure the patient's safety. The patient's presenting symptoms, physical exam findings, and initial radiographic and laboratory data in the context of their medical condition is felt to place them at decreased risk for further clinical deterioration. Furthermore, it is anticipated that the patient will be medically stable for discharge from the hospital within 2 midnights of admission.        Date of Service 09/17/2024    Caleb Exon [facility]   If 7PM-7AM, please contact night-coverage [email or web address] 09/17/2024, 1:45 AM

## 2024-09-17 ENCOUNTER — Observation Stay

## 2024-09-17 DIAGNOSIS — J939 Pneumothorax, unspecified: Secondary | ICD-10-CM | POA: Diagnosis not present

## 2024-09-17 DIAGNOSIS — R410 Disorientation, unspecified: Secondary | ICD-10-CM | POA: Diagnosis not present

## 2024-09-17 DIAGNOSIS — E785 Hyperlipidemia, unspecified: Secondary | ICD-10-CM | POA: Diagnosis present

## 2024-09-17 DIAGNOSIS — S2242XA Multiple fractures of ribs, left side, initial encounter for closed fracture: Secondary | ICD-10-CM | POA: Diagnosis not present

## 2024-09-17 LAB — BASIC METABOLIC PANEL WITH GFR
Anion gap: 12 (ref 5–15)
BUN: 42 mg/dL — ABNORMAL HIGH (ref 8–23)
CO2: 21 mmol/L — ABNORMAL LOW (ref 22–32)
Calcium: 9.1 mg/dL (ref 8.9–10.3)
Chloride: 109 mmol/L (ref 98–111)
Creatinine, Ser: 1.94 mg/dL — ABNORMAL HIGH (ref 0.61–1.24)
GFR, Estimated: 32 mL/min — ABNORMAL LOW (ref >60)
Glucose, Bld: 108 mg/dL — ABNORMAL HIGH (ref 70–99)
Potassium: 4.5 mmol/L (ref 3.5–5.1)
Sodium: 142 mmol/L (ref 135–145)

## 2024-09-17 LAB — CBC
HCT: 29.7 % — ABNORMAL LOW (ref 39.0–52.0)
Hemoglobin: 9.9 g/dL — ABNORMAL LOW (ref 13.0–17.0)
MCH: 32.8 pg (ref 26.0–34.0)
MCHC: 33.3 g/dL (ref 30.0–36.0)
MCV: 98.3 fL (ref 80.0–100.0)
Platelets: 132 K/uL — ABNORMAL LOW (ref 150–400)
RBC: 3.02 MIL/uL — ABNORMAL LOW (ref 4.22–5.81)
RDW: 11.9 % (ref 11.5–15.5)
WBC: 9.6 K/uL (ref 4.0–10.5)
nRBC: 0 % (ref 0.0–0.2)

## 2024-09-17 LAB — APTT: aPTT: 30 s (ref 24–36)

## 2024-09-17 LAB — PROTIME-INR
INR: 1.2 (ref 0.8–1.2)
Prothrombin Time: 15.4 s — ABNORMAL HIGH (ref 11.4–15.2)

## 2024-09-17 MED ORDER — FERROUS GLUCONATE 324 (38 FE) MG PO TABS
324.0000 mg | ORAL_TABLET | Freq: Every evening | ORAL | Status: DC
  Administered 2024-09-17 – 2024-09-21 (×5): 324 mg via ORAL
  Filled 2024-09-17 (×6): qty 1

## 2024-09-17 MED ORDER — PANTOPRAZOLE SODIUM 40 MG PO TBEC
40.0000 mg | DELAYED_RELEASE_TABLET | Freq: Every day | ORAL | Status: DC
  Administered 2024-09-17 – 2024-09-22 (×6): 40 mg via ORAL
  Filled 2024-09-17 (×6): qty 1

## 2024-09-17 MED ORDER — QUETIAPINE FUMARATE 25 MG PO TABS
25.0000 mg | ORAL_TABLET | Freq: Every day | ORAL | Status: DC
  Administered 2024-09-17: 25 mg via ORAL
  Filled 2024-09-17: qty 1

## 2024-09-17 MED ORDER — ATORVASTATIN CALCIUM 20 MG PO TABS
20.0000 mg | ORAL_TABLET | Freq: Every day | ORAL | Status: DC
  Administered 2024-09-17 – 2024-09-22 (×6): 20 mg via ORAL
  Filled 2024-09-17 (×6): qty 1

## 2024-09-17 MED ORDER — OXYCODONE-ACETAMINOPHEN 5-325 MG PO TABS
0.5000 | ORAL_TABLET | ORAL | Status: DC | PRN
Start: 2024-09-17 — End: 2024-09-22
  Administered 2024-09-19 – 2024-09-21 (×2): 0.5 via ORAL
  Filled 2024-09-17 (×2): qty 1

## 2024-09-17 MED ORDER — LACTATED RINGERS IV SOLN: INTRAVENOUS | Status: AC

## 2024-09-17 MED ORDER — DILTIAZEM HCL ER 90 MG PO CP12
90.0000 mg | ORAL_CAPSULE | Freq: Every day | ORAL | Status: DC
  Administered 2024-09-17 – 2024-09-22 (×6): 90 mg via ORAL
  Filled 2024-09-17 (×6): qty 1

## 2024-09-17 MED ORDER — TETANUS-DIPHTH-ACELL PERTUSSIS 5-2-15.5 LF-MCG/0.5 IM SUSP: 0.5000 mL | Freq: Once | INTRAMUSCULAR | Status: DC

## 2024-09-17 MED ORDER — APIXABAN 2.5 MG PO TABS
2.5000 mg | ORAL_TABLET | Freq: Two times a day (BID) | ORAL | Status: DC
  Administered 2024-09-17 – 2024-09-22 (×10): 2.5 mg via ORAL
  Filled 2024-09-17 (×11): qty 1

## 2024-09-17 MED ORDER — TETANUS-DIPHTH-ACELL PERTUSSIS 5-2-15.5 LF-MCG/0.5 IM SUSP
0.5000 mL | Freq: Once | INTRAMUSCULAR | Status: AC
  Administered 2024-09-17: 0.5 mL via INTRAMUSCULAR
  Filled 2024-09-17: qty 0.5

## 2024-09-17 NOTE — Consult Note (Signed)
 WOC Nurse Consult Note: Reason for Consult: R hand and R elbow skin tears  Wound type: full thickness R hand with dry hemorrhagic tissue some red moist; full thickness R elbow red moist both r/t trauma  Pressure Injury POA: NA  Measurement: see nursing flowsheet  Wound bed: as above  Drainage (amount, consistency, odor) serosanguinous  Periwound: ecchymosis noted to arm and hand  Dressing procedure/placement/frequency: Cleanse R hand and R elbow skin tears with NS, apply Xeroform gauze ([place]son [phone]) to wound beds daily and secure with silicone foam or kerlix roll gauze whichever is preferred/works best.  SOAK DRESSINGS IN NS IF ADHERED TO WOUND BED FOR ATRAUMATIC REMOVAL.   POC discussed with bedside nurse. WOC team will not follow. Re-consult if further needs arise.   Thank you,    Powell Bar MSN, RN-BC, [employer]

## 2024-09-17 NOTE — Hospital Course (Signed)
 Lance Diaz is a 89 y.o. male with medical history significant of HTN, HLD, diet-controlled diabetes, SSS (s/p of PPM), A-fib on Eliquis, GERD, BPH, CKD-3, carotid artery stenosis, who presents with fall.  Patient also had some confusion while in the hospital.  He was recently given tramadol  for pain. He was found to have a left-sided rib fracture and a small left pneumothorax.

## 2024-09-17 NOTE — Plan of Care (Signed)
  Problem: Education: Goal: Ability to describe self-care measures that may prevent or decrease complications (Diabetes Survival Skills Education) will improve Outcome: Progressing Goal: Individualized Educational Video(s) Outcome: Progressing   Problem: Coping: Goal: Ability to adjust to condition or change in health will improve Outcome: Progressing   Problem: Fluid Volume: Goal: Ability to maintain a balanced intake and output will improve Outcome: Progressing   Problem: Health Behavior/Discharge Planning: Goal: Ability to identify and utilize available resources and services will improve Outcome: Progressing Goal: Ability to manage health-related needs will improve Outcome: Progressing   Problem: Nutritional: Goal: Maintenance of adequate nutrition will improve Outcome: Progressing Goal: Progress toward achieving an optimal weight will improve Outcome: Progressing   Problem: Skin Integrity: Goal: Risk for impaired skin integrity will decrease Outcome: Progressing   Problem: Health Behavior/Discharge Planning: Goal: Ability to manage health-related needs will improve Outcome: Progressing   Problem: Education: Goal: Knowledge of General Education information will improve Description: Including pain rating scale, medication(s)/side effects and non-pharmacologic comfort measures Outcome: Progressing   Problem: Clinical Measurements: Goal: Ability to maintain clinical measurements within normal limits will improve Outcome: Progressing Goal: Will remain free from infection Outcome: Progressing Goal: Diagnostic test results will improve Outcome: Progressing Goal: Respiratory complications will improve Outcome: Progressing Goal: Cardiovascular complication will be avoided Outcome: Progressing   Problem: Nutrition: Goal: Adequate nutrition will be maintained Outcome: Progressing   Problem: Activity: Goal: Risk for activity intolerance will decrease Outcome:  Progressing   Problem: Elimination: Goal: Will not experience complications related to bowel motility Outcome: Progressing Goal: Will not experience complications related to urinary retention Outcome: Progressing   Problem: Coping: Goal: Level of anxiety will decrease Outcome: Progressing

## 2024-09-17 NOTE — Care Management Obs Status (Signed)
 MEDICARE OBSERVATION STATUS NOTIFICATION   Patient Details  Name: AMROM ORE MRN: [id number] Date of Birth: [birth year]   Medicare Observation Status Notification Given:  Yes    [name] 09/17/2024, 10:20 AM

## 2024-09-17 NOTE — Progress Notes (Signed)
 OT Cancellation Note  Patient Details Name: Lance Diaz MRN: [id number] DOB: Nov 19, 1931   Cancelled Treatment:    Reason Eval/Treat Not Completed: Patient at procedure or test/ unavailable. Per RN, pt about to go off unit for further chest imaging. OT will continue to follow and see as appropriate.   Kohle Winner L. Jayle Solarz, OTR/L  09/17/24, 10:47 AM

## 2024-09-17 NOTE — Evaluation (Signed)
 Occupational Therapy Evaluation Patient Details Name: Lance Diaz MRN: [id number] DOB: October 07, 1931 Today's Date: 09/17/2024   History of Present Illness   Pt is a 89 y.o. male admitted with L pneumothorax, L rib fractures following 2x unwitnessed falls at home. CT head and neck neg, X-ray of both wrist neg, X-ray of L-spine shows age indeterminate T12 compression fx. PMH significant for HTN, HLD, diet-controlled diabetes, SSS (s/p of PPM), A-fib on Eliquis, GERD, BPH, CKD-3, carotid artery stenosis     Clinical Impressions Pt admitted with above. Pt is a questionable historian, pleasantly confused and alert only to self. Per chart, pt is still driving and normally cognitively intact. Per pt, he is independent with ADLs and mobility. Pt significantly HOH, and has a difficult time comprehending / following directions without max multimodal cuing. Performs bed mobility with supervision, performs STS transfers with / without AD up to MIN A +1, pt could not coordinate use of AD to take pivotal steps to recliner (pt picks up RW instead), OT provides HHA to guide pt to sit in chair. Pt tangential, and often restless, pulling at gown and difficult to redirect. Able to doff/don socks seated recliner level with supervision using seated figure four. Anticipate +2 helpful for further progression of mobility with chair follow for safety due to confusion/cognition. Pt would benefit from skilled OT services to address noted impairments and functional limitations (see below for any additional details) in order to maximize safety and independence while minimizing falls risk and caregiver burden. Anticipate the need for follow up OT services upon acute hospital DC.      If plan is discharge home, recommend the following:   A little help with walking and/or transfers;A little help with bathing/dressing/bathroom;Supervision due to cognitive status;Help with stairs or ramp for entrance;Assistance with  cooking/housework;Direct supervision/assist for medications management;Direct supervision/assist for financial management;Assist for transportation     Functional Status Assessment   Patient has had a recent decline in their functional status and demonstrates the ability to make significant improvements in function in a reasonable and predictable amount of time.     Equipment Recommendations   BSC/3in1     Recommendations for Other Services         Precautions/Restrictions   Precautions Precautions: Fall Recall of Precautions/Restrictions: Impaired Precaution/Restrictions Comments: L rib fx, T12 compression fx (no brace needed per MD Laurita) Restrictions Weight Bearing Restrictions Per Provider Order: No     Mobility Bed Mobility Overal bed mobility: Needs Assistance Bed Mobility: Supine to Sit     Supine to sit: Used rails, HOB elevated          Transfers Overall transfer level: Needs assistance Equipment used: Rolling walker (2 wheels), None, 1 person hand held assist Transfers: Sit to/from Stand, Bed to chair/wheelchair/BSC Sit to Stand: Min assist     Step pivot transfers: Min assist            Balance Overall balance assessment: History of Falls, Needs assistance Sitting-balance support: Feet supported, No upper extremity supported Sitting balance-Leahy Scale: Good Sitting balance - Comments: no LOB while donning / doffing socks   Standing balance support: Single extremity supported, During functional activity Standing balance-Leahy Scale: Fair                             ADL either performed or assessed with clinical judgement   ADL Overall ADL's : Needs assistance/impaired Eating/Feeding: Independent Eating/Feeding Details (indicate cue type and  reason): no difficulites opening straw and manipulating containers to self-feed Grooming: Wash/dry hands;Wash/dry face;Sitting;Set up Grooming Details (indicate cue type and reason):  sitting EOB         Upper Body Dressing : Contact guard assist;Sitting Upper Body Dressing Details (indicate cue type and reason): dons gown Lower Body Dressing: Sitting/lateral leans;Supervision/safety Lower Body Dressing Details (indicate cue type and reason): doff/dons socks using seated figure four Toilet Transfer: Minimal assistance;BSC/3in1 Toilet Transfer Details (indicate cue type and reason): attempted to perform step pivot bed > recliner using RW, pt had difficulties comprehending AD use (picked up RW instead), OT removes AD and pt is able to take pivotal steps towards recliner, no AD, up to MIN A steadying HHA. pt difficulties following commands due to [facility]. Toileting- Clothing Manipulation and Hygiene: Minimal assistance;Sit to/from stand       Functional mobility during ADLs: +2 for safety/equipment;Minimal assistance General ADL Comments: anticipate pt will require +2 for mobility progression for safety due to confusion / difficulties following commands with HOH     Vision Baseline Vision/History: 1 Wears glasses Ability to See in Adequate Light: 0 Adequate              Pertinent Vitals/Pain Pain Assessment Pain Assessment: Faces Faces Pain Scale: Hurts even more Pain Location: L ribs with movement Pain Descriptors / Indicators: Grimacing, Guarding, Dull Pain Intervention(s): Limited activity within patient's tolerance, Monitored during session, Patient requesting pain meds-RN notified     Extremity/Trunk Assessment Upper Extremity Assessment Upper Extremity Assessment: Right hand dominant;Difficult to assess due to impaired cognition;Overall WFL for tasks assessed (did not formally assess due to rib fx, WFL with equal bilateral grip strength)   Lower Extremity Assessment Lower Extremity Assessment: Defer to PT evaluation   Cervical / Trunk Assessment Cervical / Trunk Assessment: Normal   Communication Communication Communication: Impaired Factors Affecting  Communication: Hearing impaired   Cognition Arousal: Alert Behavior During Therapy: Restless Cognition: No family/caregiver present to determine baseline, Cognition impaired   Orientation impairments: Situation, Time, Place Awareness: Intellectual awareness impaired Memory impairment (select all impairments): Working [employer] fast streamer, Programmer, systems, Non-declarative long-term memory, Engineer, structural memory Attention impairment (select first level of impairment): Sustained attention Executive functioning impairment (select all impairments): Problem solving, Reasoning OT - Cognition Comments: pt recieved in bed without clothes on, restless (intermittently grabbing at gown/leads, difficult to redirect at times, question impact of HOH on ability to comprehend/follow commands). pt alert only to self, tangetial, pleasantly confused                 Following commands: Impaired Following commands impaired: Follows one step commands inconsistently     Cueing  General Comments   Cueing Techniques: Verbal cues;Gestural cues;Tactile cues (requires multimodal cuing)  VSS on RA           Home Living Family/patient expects to be discharged to:: Private residence Living Arrangements: Spouse/significant other Available Help at Discharge: Family;Available PRN/intermittently Type of Home: House Home Access: Stairs to enter [employer] of Steps: 2   Home Layout: One level     Bathroom Shower/Tub: Tub/shower unit;Walk-in shower   Bathroom Toilet: Standard     Home Equipment: Agricultural consultant (2 wheels)          Prior Functioning/Environment Prior Level of Function : Patient poor historian/Family not available;History of Falls (last six months);Driving             Mobility Comments: per chart, pt driving. pt denies falls hx. pt states he does not use  AD for mobility ADLs Comments: independent per pt    OT Problem List: Decreased activity tolerance;Impaired balance  (sitting and/or standing);Pain;Decreased knowledge of precautions;Decreased knowledge of use of DME or AE;Decreased safety awareness;Decreased cognition   OT Treatment/Interventions: Self-care/ADL training;Therapeutic exercise;Neuromuscular education;Energy conservation;DME and/or AE instruction;Splinting;Therapeutic activities;Balance training;Patient/family education      OT Goals(Current goals can be found in the care plan section)   Acute Rehab OT Goals OT Goal Formulation: Patient unable to participate in goal setting Time For Goal Achievement: 10/01/24 Potential to Achieve Goals: Fair   OT Frequency:  Min 2X/week    Co-evaluation              AM-PAC OT 6 Clicks Daily Activity     Outcome Measure Help from another person eating meals?: None Help from another person taking care of personal grooming?: None Help from another person toileting, which includes using toliet, bedpan, or urinal?: A Little Help from another person bathing (including washing, rinsing, drying)?: A Little Help from another person to put on and taking off regular upper body clothing?: A Little Help from another person to put on and taking off regular lower body clothing?: A Little 6 Click Score: 20   End of Session Equipment Utilized During Treatment: Rolling walker (2 wheels) Nurse Communication: Mobility status  Activity Tolerance: Patient tolerated treatment well Patient left: in chair;with call bell/phone within reach;with chair alarm set;with nursing/sitter in room  OT Visit Diagnosis: Other abnormalities of gait and mobility (R26.89);History of falling (Z91.81);Muscle weakness (generalized) (M62.81);Pain;Other symptoms and signs involving cognitive function Pain - Right/Left: Left Pain - part of body:  (ribs)                Time: 8480-8449 OT Time Calculation (min): 31 min Charges:  OT General Charges $OT Visit: 1 Visit OT Evaluation $OT Eval Moderate Complexity: 1 Mod OT  Treatments $Self Care/Home Management : 8-22 mins  Stefanee Mckell L. Elli Groesbeck, OTR/L  09/17/24, 4:55 PM

## 2024-09-17 NOTE — Progress Notes (Signed)
  Progress Note   Patient: Lance Diaz FMW:[id number] DOB: June 20, 1931 DOA: 09/16/2024     0 DOS: the patient was seen and examined on 09/17/2024   Brief Diaz course: Lance Diaz is a 89 y.o. male with medical history significant of HTN, HLD, diet-controlled diabetes, SSS (s/p of PPM), A-fib on Eliquis, GERD, BPH, CKD-3, carotid artery stenosis, who presents with fall.  Patient also had some confusion while in the Diaz.  He was recently given tramadol  for pain. He was found to have a left-sided rib fracture and a small left pneumothorax.   Principal Problem:   Pneumothorax on left Active Problems:   Left rib fracture   Fall at home, initial encounter   Benign essential HTN   Paroxysmal A-fib (HCC)   HLD (hyperlipidemia)   Type 2 diabetes mellitus with diabetic nephropathy (HCC)   Chronic kidney disease, stage 3a (HCC)   Leukocytosis   Thrombocytopenia   Normocytic anemia   Delirium   Assessment and Plan:  Pneumothorax on left: Left rib fracture. Mechanical fall. Pt has very small left apical pneumothorax on X-ray and Ct of c spin. No oxygen desaturation.   Chest pain is better controlled.  Repeated chest x-ray showed reduced pneumothorax. Continue to follow.  Delirium:  Patient appeared very confused, discussed with patient daughter, patient normally does not have any dementia.  He was still driving. Etiology of delirium is still unclear, could be due to tramadol .  CT head did not show any acute changes. Patient has slightly worsening renal function, with reduced p.o. intake.  Will give gentle rehydration.  Also added Seroquel to improve sleep pattern.   Benign essential HTN On diltiazem.   Paroxysmal A-fib Lance Diaz): Heart rate 70s Continue diltiazem, restart Eliquis.   HLD (hyperlipidemia) -Lipid   Diet controlled Type 2 diabetes mellitus with diabetic nephropathy (HCC): Recent A1c 5.9, well-controlled.  Patient's not taking medications  currently.   Chronic kidney disease, stage 3a (HCC): Baseline creatinine 1.5, slightly worsening.  Give a liter of fluid infusion.   Thrombocytopenia B12 deficient anemia.:  Patient has a mild thrombocytopenia, which is improving.  Patient was chronically receiving B12 injection every month.  Continue to follow.      Subjective:  Patient has some confusion today, no agitation.  Denies any short of breath.  Physical Exam: Vitals:   09/17/24 0101 09/17/24 0500 09/17/24 0500 09/17/24 0834  BP: (!) 166/85  (!) 153/64 (!) 148/60  Pulse: 83  75 (!) 49  Resp: 20  20 18   Temp: 98.4 F (36.9 C)  98.5 F (36.9 C) 97.7 F (36.5 C)  TempSrc: Oral  Oral   SpO2: 100%  97%   Weight:  66.1 kg     General exam: Appears calm and comfortable  Respiratory system: Clear to auscultation. Respiratory effort normal. Cardiovascular system: S1 & S2 heard, RRR. No JVD, murmurs, rubs, gallops or clicks. No pedal edema. Gastrointestinal system: Abdomen is nondistended, soft and nontender. No organomegaly or masses felt. Normal bowel sounds heard. Central nervous system: Alert and oriented x1. No focal neurological deficits. Extremities: Symmetric 5 x 5 power. Skin: No rashes, lesions or ulcers Psychiatry: Flat affect   Data Reviewed:  Reviewed CT scan results and lab results.  Family Communication: Daughter updated over the phone  Disposition: Status is: Observation      Time spent: 35 minutes  Author: Murvin Mana, MD 09/17/2024 2:18 PM  For on call review [email or web address].

## 2024-09-17 NOTE — Plan of Care (Signed)

## 2024-09-18 ENCOUNTER — Observation Stay

## 2024-09-18 DIAGNOSIS — E7800 Pure hypercholesterolemia, unspecified: Secondary | ICD-10-CM | POA: Diagnosis present

## 2024-09-18 DIAGNOSIS — M4854XA Collapsed vertebra, not elsewhere classified, thoracic region, initial encounter for fracture: Secondary | ICD-10-CM | POA: Diagnosis present

## 2024-09-18 DIAGNOSIS — Z7984 Long term (current) use of oral hypoglycemic drugs: Secondary | ICD-10-CM | POA: Diagnosis not present

## 2024-09-18 DIAGNOSIS — D696 Thrombocytopenia, unspecified: Secondary | ICD-10-CM | POA: Diagnosis present

## 2024-09-18 DIAGNOSIS — S2242XA Multiple fractures of ribs, left side, initial encounter for closed fracture: Secondary | ICD-10-CM | POA: Diagnosis present

## 2024-09-18 DIAGNOSIS — J189 Pneumonia, unspecified organism: Secondary | ICD-10-CM | POA: Diagnosis not present

## 2024-09-18 DIAGNOSIS — N1831 Chronic kidney disease, stage 3a: Secondary | ICD-10-CM | POA: Diagnosis present

## 2024-09-18 DIAGNOSIS — S270XXA Traumatic pneumothorax, initial encounter: Secondary | ICD-10-CM | POA: Diagnosis present

## 2024-09-18 DIAGNOSIS — G9341 Metabolic encephalopathy: Secondary | ICD-10-CM | POA: Diagnosis present

## 2024-09-18 DIAGNOSIS — Z8249 Family history of ischemic heart disease and other diseases of the circulatory system: Secondary | ICD-10-CM | POA: Diagnosis not present

## 2024-09-18 DIAGNOSIS — Z79899 Other long term (current) drug therapy: Secondary | ICD-10-CM | POA: Diagnosis not present

## 2024-09-18 DIAGNOSIS — Z66 Do not resuscitate: Secondary | ICD-10-CM | POA: Diagnosis present

## 2024-09-18 DIAGNOSIS — I129 Hypertensive chronic kidney disease with stage 1 through stage 4 chronic kidney disease, or unspecified chronic kidney disease: Secondary | ICD-10-CM | POA: Diagnosis present

## 2024-09-18 DIAGNOSIS — Y92009 Unspecified place in unspecified non-institutional (private) residence as the place of occurrence of the external cause: Secondary | ICD-10-CM | POA: Diagnosis not present

## 2024-09-18 DIAGNOSIS — I480 Paroxysmal atrial fibrillation: Secondary | ICD-10-CM | POA: Diagnosis present

## 2024-09-18 DIAGNOSIS — D519 Vitamin B12 deficiency anemia, unspecified: Secondary | ICD-10-CM | POA: Diagnosis present

## 2024-09-18 DIAGNOSIS — E1151 Type 2 diabetes mellitus with diabetic peripheral angiopathy without gangrene: Secondary | ICD-10-CM | POA: Diagnosis present

## 2024-09-18 DIAGNOSIS — W19XXXA Unspecified fall, initial encounter: Secondary | ICD-10-CM | POA: Diagnosis present

## 2024-09-18 DIAGNOSIS — S060X0A Concussion without loss of consciousness, initial encounter: Secondary | ICD-10-CM | POA: Diagnosis present

## 2024-09-18 DIAGNOSIS — J939 Pneumothorax, unspecified: Secondary | ICD-10-CM | POA: Diagnosis not present

## 2024-09-18 DIAGNOSIS — Z23 Encounter for immunization: Secondary | ICD-10-CM | POA: Diagnosis not present

## 2024-09-18 DIAGNOSIS — E1122 Type 2 diabetes mellitus with diabetic chronic kidney disease: Secondary | ICD-10-CM | POA: Diagnosis present

## 2024-09-18 DIAGNOSIS — F05 Delirium due to known physiological condition: Secondary | ICD-10-CM | POA: Diagnosis present

## 2024-09-18 DIAGNOSIS — Z7901 Long term (current) use of anticoagulants: Secondary | ICD-10-CM | POA: Diagnosis not present

## 2024-09-18 DIAGNOSIS — S61411A Laceration without foreign body of right hand, initial encounter: Secondary | ICD-10-CM | POA: Diagnosis present

## 2024-09-18 DIAGNOSIS — F0390 Unspecified dementia without behavioral disturbance: Secondary | ICD-10-CM | POA: Diagnosis present

## 2024-09-18 DIAGNOSIS — N400 Enlarged prostate without lower urinary tract symptoms: Secondary | ICD-10-CM | POA: Diagnosis present

## 2024-09-18 LAB — BASIC METABOLIC PANEL WITH GFR
Anion gap: 8 (ref 5–15)
BUN: 38 mg/dL — ABNORMAL HIGH (ref 8–23)
CO2: 24 mmol/L (ref 22–32)
Calcium: 8.9 mg/dL (ref 8.9–10.3)
Chloride: 109 mmol/L (ref 98–111)
Creatinine, Ser: 1.55 mg/dL — ABNORMAL HIGH (ref 0.61–1.24)
GFR, Estimated: 42 mL/min — ABNORMAL LOW (ref >60)
Glucose, Bld: 109 mg/dL — ABNORMAL HIGH (ref 70–99)
Potassium: 4.7 mmol/L (ref 3.5–5.1)
Sodium: 141 mmol/L (ref 135–145)

## 2024-09-18 LAB — MAGNESIUM: Magnesium: 2.3 mg/dL (ref 1.7–2.4)

## 2024-09-18 MED ORDER — AZITHROMYCIN 250 MG PO TABS
500.0000 mg | ORAL_TABLET | Freq: Every day | ORAL | Status: AC
Start: 2024-09-18 — End: ?
  Administered 2024-09-18: 500 mg via ORAL
  Filled 2024-09-18: qty 2

## 2024-09-18 MED ORDER — QUETIAPINE FUMARATE 25 MG PO TABS
12.5000 mg | ORAL_TABLET | Freq: Every day | ORAL | Status: DC
  Administered 2024-09-18 – 2024-09-21 (×4): 12.5 mg via ORAL
  Filled 2024-09-18 (×4): qty 1

## 2024-09-18 MED ORDER — MELATONIN 5 MG PO TABS
5.0000 mg | ORAL_TABLET | Freq: Every day | ORAL | Status: DC
  Administered 2024-09-18 – 2024-09-21 (×4): 5 mg via ORAL
  Filled 2024-09-18 (×4): qty 1

## 2024-09-18 MED ORDER — SODIUM CHLORIDE 0.9 % IV SOLN
1.0000 g | INTRAVENOUS | Status: DC
  Administered 2024-09-18: 1 g via INTRAVENOUS
  Filled 2024-09-18 (×2): qty 10

## 2024-09-18 NOTE — Progress Notes (Signed)
 Mobility Specialist - Progress Note   09/18/24 1053  Mobility  Activity Pivoted/transferred from bed to chair  Level of Assistance Minimal assist, patient does 75% or more  Assistive Device Front wheel walker  Distance Ambulated (ft) 4 ft  Activity Response Tolerated well  Mobility visit 1 Mobility  Mobility Specialist Start Time (ACUTE ONLY) 1038  Mobility Specialist Stop Time (ACUTE ONLY) 1052  Mobility Specialist Time Calculation (min) (ACUTE ONLY) 14 min   Pt supine upon entry, utilizing RA. Pt transferred to the recliner via SPT MinA +1 with CGA for safety. Pt endorsed pain all over throughout session, requiring curing for proper hand placement on the RW when standing. RN notified.   America Silvan Mobility Specialist 09/18/24 11:07 AM

## 2024-09-18 NOTE — Plan of Care (Addendum)
 Pt alert to self. Forgetful of location, time and situation. Increasing agitation during overnight. Pt thinking that his wife was in the hall hollering for him. Weak with standing not following directions. Concerned and agitated about his wife. Pt received 1 dose of tylenol  for rib pain.  Problem: Education: Goal: Ability to describe self-care measures that may prevent or decrease complications (Diabetes Survival Skills Education) will improve Outcome: Not Progressing   Problem: Coping: Goal: Ability to adjust to condition or change in health will improve Outcome: Not Progressing   Problem: Education: Goal: Knowledge of General Education information will improve Description: Including pain rating scale, medication(s)/side effects and non-pharmacologic comfort measures Outcome: Not Progressing   Problem: Health Behavior/Discharge Planning: Goal: Ability to manage health-related needs will improve Outcome: Not Progressing   Problem: Activity: Goal: Risk for activity intolerance will decrease Outcome: Not Progressing   Problem: Coping: Goal: Level of anxiety will decrease Outcome: Not Progressing   Problem: Metabolic: Goal: Ability to maintain appropriate glucose levels will improve Outcome: Progressing   Problem: Nutritional: Goal: Maintenance of adequate nutrition will improve Outcome: Progressing Goal: Progress toward achieving an optimal weight will improve Outcome: Progressing   Problem: Skin Integrity: Goal: Risk for impaired skin integrity will decrease Outcome: Progressing   Problem: Tissue Perfusion: Goal: Adequacy of tissue perfusion will improve Outcome: Progressing   Problem: Clinical Measurements: Goal: Ability to maintain clinical measurements within normal limits will improve Outcome: Progressing Goal: Will remain free from infection Outcome: Progressing Goal: Diagnostic test results will improve Outcome: Progressing Goal: Respiratory complications will  improve Outcome: Progressing Goal: Cardiovascular complication will be avoided Outcome: Progressing   Problem: Nutrition: Goal: Adequate nutrition will be maintained Outcome: Progressing   Problem: Elimination: Goal: Will not experience complications related to bowel motility Outcome: Progressing Goal: Will not experience complications related to urinary retention Outcome: Progressing   Problem: Pain Managment: Goal: General experience of comfort will improve and/or be controlled Outcome: Progressing   Problem: Safety: Goal: Ability to remain free from injury will improve Outcome: Progressing   Problem: Skin Integrity: Goal: Risk for impaired skin integrity will decrease Outcome: Progressing

## 2024-09-18 NOTE — Progress Notes (Signed)
 PT Cancellation Note  Patient Details Name: Lance Diaz MRN: [id number] DOB: [birth year]   Cancelled Treatment:    Reason Eval/Treat Not Completed: Fatigue/lethargy limiting ability to participate (Evaluation re-attempted. Patient sleeping soundly; opens eyes briefly to max stim from therapist, but unable to maintain for participation with session. Per son, has been OOB to chair this AM and recently received pain meds/muscle relaxer. Will allow patient to rest and re-attempt at later time/date as medically appropriate and available.)  Natallia Stellmach H. Delores, PT, DPT, NCS 09/18/24, 1:57 PM [phone]

## 2024-09-18 NOTE — Progress Notes (Signed)
 OT Cancellation Note  Patient Details Name: Lance Diaz MRN: [id number] DOB: [birth year]   Cancelled Treatment:    Reason Eval/Treat Not Completed: Fatigue/lethargy limiting ability to participate. Pt sleeping soundly. Per chart, pt has been OOB to chair and had pain meds earlier this afternoon. OT will re-attempt at later date as appropriate.   Kashis Penley L. Chelsea Pedretti, OTR/L  09/18/24, 4:05 PM

## 2024-09-18 NOTE — Progress Notes (Signed)
 Progress Note   Patient: Lance Diaz FMW:[id number] DOB: 1931-09-06 DOA: 09/16/2024     0 DOS: the patient was seen and examined on 09/18/2024   Brief hospital course: Lance Diaz is a 89 y.o. male with medical history significant of HTN, HLD, diet-controlled diabetes, SSS (s/p of PPM), A-fib on Eliquis, GERD, BPH, CKD-3, carotid artery stenosis, who presents with fall.  Patient also had some confusion while in the hospital.  He was recently given tramadol  for pain. He was found to have a left-sided rib fracture and a small left pneumothorax.   Principal Problem:   Pneumothorax on left Active Problems:   Left rib fracture   Fall at home, initial encounter   Benign essential HTN   Paroxysmal A-fib (HCC)   HLD (hyperlipidemia)   Type 2 diabetes mellitus with diabetic nephropathy (HCC)   Chronic kidney disease, stage 3a (HCC)   Leukocytosis   Thrombocytopenia   Normocytic anemia   Delirium   Acute metabolic encephalopathy   LLL pneumonia   Assessment and Plan: Pneumothorax on left: Left rib fracture. Mechanical fall. Pt has very small left apical pneumothorax on X-ray and Ct of c spin. No oxygen desaturation.   Chest pain is better controlled.  Repeated chest x-ray showed reduced pneumothorax.  Condition continued to improve today.    Delirium likely due to head concussion and metabolic encephalopathy Acute metabolic encephalopathy secondary to pneumonia. Left lower lobe pneumonia. Two-view chest x-ray performed on 10/16 showed worsening left lower lobe infiltrates, consistent with pneumonia.  Most likely this of aspiration event, repeat chest x-ray today seem to be better.  I will obtain blood culture, started Rocephin.  Will finish 5 days of antibiotics. Patient still has significant confusion, agitation.  This is most likely due to combination of brain concussion and a metabolic encephalopathy.  Started Seroquel as well as melatonin.     Benign essential HTN On  diltiazem.   Paroxysmal A-fib [facility]): Heart rate 70s Continue diltiazem, restarted Eliquis.   HLD (hyperlipidemia) -Lipid   Diet controlled Type 2 diabetes mellitus with diabetic nephropathy (HCC): Recent A1c 5.9, well-controlled.  Patient's not taking medications currently.     Chronic kidney disease, stage 3a (HCC): Baseline creatinine 1.5, slightly worsening.  Received a liter of fluids, renal function close to baseline.   Thrombocytopenia B12 deficient anemia.:  Patient has a mild thrombocytopenia, which is improving.  Patient was chronically receiving B12 injection every month.  Continue to follow.         Subjective:  Patient was very agitated last night, did not sleep well.  Confused this morning.  Physical Exam: Vitals:   09/17/24 2040 09/18/24 0454 09/18/24 0500 09/18/24 0759  BP: (!) 151/97 (!) 160/74  (!) 166/98  Pulse: 64 81  73  Resp: 16 18  17   Temp: 97.7 F (36.5 C) 98.1 F (36.7 C)    TempSrc: Oral     SpO2: 99% 100%  100%  Weight:   65.4 kg    General exam: Appears calm and comfortable  Respiratory system: Clear to auscultation. Respiratory effort normal. Cardiovascular system: S1 & S2 heard, RRR. No JVD, murmurs, rubs, gallops or clicks. No pedal edema. Gastrointestinal system: Abdomen is nondistended, soft and nontender. No organomegaly or masses felt. Normal bowel sounds heard. Central nervous system: Drowsy and oriented x 2. No focal neurological deficits. Extremities: Symmetric 5 x 5 power. Skin: No rashes, lesions or ulcers Psychiatry: Flat affect    Data Reviewed:  Reviewed chest x-ray  and lab results  Family Communication: Daughter updated at the bedside  Disposition: Status is: Inpatient Remains inpatient appropriate because: Severity of disease, altered mental status, IV antibiotics     Time spent: 50 minutes  Author: Murvin Mana, MD 09/18/2024 10:34 AM  For on call review [email or web address].

## 2024-09-18 NOTE — Plan of Care (Signed)
  Problem: Education: Goal: Ability to describe self-care measures that may prevent or decrease complications (Diabetes Survival Skills Education) will improve Outcome: Progressing Goal: Individualized Educational Video(s) Outcome: Progressing   Problem: Coping: Goal: Ability to adjust to condition or change in health will improve Outcome: Progressing   Problem: Fluid Volume: Goal: Ability to maintain a balanced intake and output will improve Outcome: Progressing   Problem: Health Behavior/Discharge Planning: Goal: Ability to identify and utilize available resources and services will improve Outcome: Progressing   Problem: Metabolic: Goal: Ability to maintain appropriate glucose levels will improve Outcome: Progressing   Problem: Nutritional: Goal: Maintenance of adequate nutrition will improve Outcome: Progressing   Problem: Education: Goal: Knowledge of General Education information will improve Description: Including pain rating scale, medication(s)/side effects and non-pharmacologic comfort measures Outcome: Progressing   Problem: Health Behavior/Discharge Planning: Goal: Ability to manage health-related needs will improve Outcome: Progressing   Problem: Activity: Goal: Risk for activity intolerance will decrease Outcome: Progressing

## 2024-09-19 ENCOUNTER — Inpatient Hospital Stay

## 2024-09-19 DIAGNOSIS — J939 Pneumothorax, unspecified: Secondary | ICD-10-CM | POA: Diagnosis not present

## 2024-09-19 DIAGNOSIS — J189 Pneumonia, unspecified organism: Secondary | ICD-10-CM | POA: Diagnosis not present

## 2024-09-19 DIAGNOSIS — G9341 Metabolic encephalopathy: Secondary | ICD-10-CM | POA: Diagnosis not present

## 2024-09-19 DIAGNOSIS — S2242XA Multiple fractures of ribs, left side, initial encounter for closed fracture: Secondary | ICD-10-CM | POA: Diagnosis not present

## 2024-09-19 LAB — GLUCOSE, CAPILLARY: Glucose-Capillary: 144 mg/dL — ABNORMAL HIGH (ref 70–99)

## 2024-09-19 MED ORDER — CEFUROXIME AXETIL 250 MG PO TABS
250.0000 mg | ORAL_TABLET | Freq: Two times a day (BID) | ORAL | Status: DC
  Administered 2024-09-19 – 2024-09-22 (×7): 250 mg via ORAL
  Filled 2024-09-19 (×8): qty 1

## 2024-09-19 MED ORDER — AZITHROMYCIN 250 MG PO TABS
500.0000 mg | ORAL_TABLET | Freq: Every day | ORAL | Status: AC
  Administered 2024-09-19 – 2024-09-20 (×2): 500 mg via ORAL
  Filled 2024-09-19 (×2): qty 2

## 2024-09-19 NOTE — Progress Notes (Signed)
 Progress Note   Patient: Lance Diaz FMW:[id number] DOB: May 15, 1931 DOA: 09/16/2024     1 DOS: the patient was seen and examined on 09/19/2024   Brief hospital course: Lance Diaz is a 89 y.o. male with medical history significant of HTN, HLD, diet-controlled diabetes, SSS (s/p of PPM), A-fib on Eliquis, GERD, BPH, CKD-3, carotid artery stenosis, who presents with fall.  Patient also had some confusion while in the hospital.  He was recently given tramadol  for pain. He was found to have a left-sided rib fracture and a small left pneumothorax.   Principal Problem:   Pneumothorax on left Active Problems:   Left rib fracture   Fall at home, initial encounter   Benign essential HTN   Paroxysmal A-fib (HCC)   HLD (hyperlipidemia)   Type 2 diabetes mellitus with diabetic nephropathy (HCC)   Chronic kidney disease, stage 3a (HCC)   Leukocytosis   Thrombocytopenia   Normocytic anemia   Delirium   Acute metabolic encephalopathy   LLL pneumonia   Assessment and Plan: Pneumothorax on left: Left rib fracture. Mechanical fall. Pt has very small left apical pneumothorax on X-ray and Ct of c spin. No oxygen desaturation.   Chest pain is better controlled.  Repeated chest x-ray showed reduced pneumothorax.   Still complaining with pain in the chest and the left shoulder, will continue low-dose pain medicine.     Delirium likely due to head concussion and metabolic encephalopathy Acute metabolic encephalopathy secondary to pneumonia. Left lower lobe pneumonia. Two-view chest x-ray performed on 10/16 showed worsening left lower lobe infiltrates, consistent with pneumonia.  Most likely this of aspiration event, repeat chest x-ray today seem to be better.  I will obtain blood culture, started Rocephin.  Will finish 5 days of antibiotics.  Changed to oral antibiotics on 10/17. Patient had significant confusion, agitation.  This is most likely due to combination of brain concussion and  a metabolic encephalopathy.  Started Seroquel as well as melatonin. Patient slept well last night, still drowsy and complaining of pain.  But no agitation.  Continue to follow.  Pain in the left shoulder. Obtain x-ray.     Benign essential HTN On diltiazem.   Paroxysmal A-fib [facility]): Heart rate 70s Continue diltiazem, restarted Eliquis.   HLD (hyperlipidemia) -Lipid   Diet controlled Type 2 diabetes mellitus with diabetic nephropathy (HCC): Recent A1c 5.9, well-controlled.  Patient's not taking medications currently.     Chronic kidney disease, stage 3a (HCC): Renal function has back to baseline.   Thrombocytopenia B12 deficient anemia.:  Patient has a mild thrombocytopenia, which is improving.  Patient was chronically receiving B12 injection every month.               Subjective:  Patient slept well last night, still complaining of pain in the left shoulder and the chest.  Physical Exam: Vitals:   09/18/24 1508 09/18/24 1953 09/19/24 0323 09/19/24 0911  BP: (!) 99/53 (!) 150/79 135/71 (!) 119/55  Pulse: 60 81 67 66  Resp: 17 16 20 18   Temp: 98.6 F (37 C) 98 F (36.7 C) 98.3 F (36.8 C) 98.1 F (36.7 C)  TempSrc:  Oral Oral   SpO2: 99% 99% 99% 100%  Weight:    65.4 kg  Height:    6' 0.99 (1.854 m)   General exam: Appears calm and comfortable  Respiratory system: Clear to auscultation. Respiratory effort normal. Cardiovascular system: S1 & S2 heard, RRR. No JVD, murmurs, rubs, gallops or clicks. No  pedal edema. Gastrointestinal system: Abdomen is nondistended, soft and nontender. No organomegaly or masses felt. Normal bowel sounds heard. Central nervous system: Drowsy and oriented x1. No focal neurological deficits. Extremities: Symmetric 5 x 5 power. Skin: No rashes, lesions or ulcers Psychiatry: Flat affect  Data Reviewed:  Lab results reviewed again.  Family Communication: Granddaughter updated at bedside  Disposition: Status is:  Inpatient Remains inpatient appropriate because: Severity of disease,     Time spent: 35 minutes  Author: Murvin Mana, MD 09/19/2024 12:18 PM  For on call review [email or web address].

## 2024-09-19 NOTE — Plan of Care (Signed)

## 2024-09-19 NOTE — Progress Notes (Signed)
 Occupational Therapy Treatment Patient Details Name: Lance Diaz MRN: [id number] DOB: [birth year] Today's Date: 09/19/2024   History of present illness Pt is a 89 y.o. male admitted with L pneumothorax, L rib fractures following 2x unwitnessed falls at home. CT head and neck neg, X-ray of both wrist neg, X-ray of L-spine shows age indeterminate T12 compression fx. PMH significant for HTN, HLD, diet-controlled diabetes, SSS (s/p of PPM), A-fib on Eliquis, GERD, BPH, CKD-3, carotid artery stenosis   OT comments  Pt seen for OT/PT co-session due to pain and fatigue. Granddaughter provides insight into PLOF as pt remains confused and a poor historian. 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 Pt still drives and was mowing the yard up until a few weeks ago. Pt is pleasantly confused, found to be incontinent of urine and requiring assist for hygiene + linen change. Pt requires MAX A for UB/LB dressing + bathing sitting EOB and MIN - MOD A +2 for STS and functional transfers using RW. Close chair follow for safety for hallway mobility using RW, pt verbalizing need to use BSC and performs step pivot transfer from recliner <> BSC. Pt left in recliner, needs in reach. Discharge recommendation updated.       If plan is discharge home, recommend the following:  A lot of help with walking and/or transfers;A lot of help with bathing/dressing/bathroom;Assistance with cooking/housework;Direct supervision/assist for medications management;Direct supervision/assist for financial management;Assist for transportation;Help with stairs or ramp for entrance;Supervision due to cognitive status   Equipment Recommendations  BSC/3in1       Precautions / Restrictions Precautions Precautions: Fall Recall of Precautions/Restrictions: Impaired Precaution/Restrictions Comments: L rib fx, T12 compression fx (no brace needed per MD Laurita) Restrictions Weight Bearing  Restrictions Per Provider Order: No       Mobility Bed Mobility Overal bed mobility: Needs Assistance Bed Mobility: Rolling, Sidelying to Sit Rolling: Mod assist Sidelying to sit: Min assist, +2 for physical assistance            Transfers Overall transfer level: Needs assistance Equipment used: Rolling walker (2 wheels) Transfers: Sit to/from Stand, Bed to chair/wheelchair/BSC Sit to Stand: Min assist, Mod assist, +2 physical assistance, +2 safety/equipment     Step pivot transfers: Min assist, +2 safety/equipment     General transfer comment: pt performs multiple STS transfers throughout session, MIN - MOD A +1-2     Balance Overall balance assessment: History of Falls, Needs assistance Sitting-balance support: Feet supported, No upper extremity supported Sitting balance-Leahy Scale: Good     Standing balance support: During functional activity, Bilateral upper extremity supported, Reliant on assistive device for balance Standing balance-Leahy Scale: Fair                             ADL either performed or assessed with clinical judgement   ADL Overall ADL's : Needs assistance/impaired         Upper Body Bathing: Maximal assistance;Sitting Upper Body Bathing Details (indicate cue type and reason): at EOB Lower Body Bathing: Maximal assistance;Sit to/from stand Lower Body Bathing Details (indicate cue type and reason): pt provided with washcloth, washes anterior part of legs with assist for BLE Upper Body Dressing : Maximal assistance;Sitting Upper Body Dressing Details (indicate cue type and reason): at EOB to don / doff gown Lower Body Dressing: Maximal assistance;Bed level Lower Body Dressing Details (indicate cue type and reason): socks Toilet  Transfer: BSC/3in1;Stand-pivot;Cueing for sequencing;Cueing for safety;+2 for safety/equipment;Minimal assistance;Moderate assistance Toilet Transfer Details (indicate cue type and reason): pt verbalizes  need for BM, transfers recliner <> BSC with MIN A +2 for safety with RW. Toileting- Clothing Manipulation and Hygiene: Sit to/from stand;Maximal assistance Toileting - Clothing Manipulation Details (indicate cue type and reason): pt found to be incontinent of urine in bed, pericare completed from STS     Functional mobility during ADLs: +2 for safety/equipment;Minimal assistance General ADL Comments: pt found to be incontinent of urine with gown and linen wet, unaware. assist for UB/LB bathing dressing     Communication Communication Communication: Impaired Factors Affecting Communication: Hearing impaired   Cognition Arousal: Alert Behavior During Therapy: WFL for tasks assessed/performed Cognition: Cognition impaired     Awareness: Intellectual awareness impaired Memory impairment (select all impairments): Working [employer] fast streamer, Short-term memory, Non-declarative long-term memory, Engineer, structural memory Attention impairment (select first level of impairment): Sustained attention Executive functioning impairment (select all impairments): Problem solving, Reasoning OT - Cognition Comments: granddaughter states pt not at cognitive baseline. pt tangetial, confused but improved command following from inital OT eval.                 Following commands: Impaired Following commands impaired: Follows one step commands with increased time, Follows multi-step commands with increased time      Cueing   Cueing Techniques: Verbal cues, Gestural cues, Tactile cues (requires multimodal cuing)        General Comments Increased rib pain and SOB with STS transfers, VSS on RA.    Pertinent Vitals/ Pain       Pain Assessment Pain Assessment: Faces Faces Pain Scale: Hurts even more Pain Location: L ribs with movement, back Pain Descriptors / Indicators: Grimacing, Guarding, Dull Pain Intervention(s): Limited activity within patient's tolerance, Monitored during session, Premedicated before  session, Repositioned   Frequency  Min 2X/week        Progress Toward Goals  OT Goals(current goals can now be found in the care plan section)  Progress towards OT goals: Progressing toward goals  Acute Rehab OT Goals OT Goal Formulation: Patient unable to participate in goal setting Time For Goal Achievement: 10/01/24 Potential to Achieve Goals: Fair ADL Goals Pt Will Perform Grooming: with modified independence;standing;sitting Pt Will Perform Upper Body Dressing: with modified independence;sitting Pt Will Perform Lower Body Dressing: with modified independence;sit to/from stand;sitting/lateral leans Pt Will Transfer to Toilet: with modified independence;ambulating;grab bars Pt Will Perform Toileting - Clothing Manipulation and hygiene: with modified independence;sitting/lateral leans;sit to/from stand  Plan      Co-evaluation    PT/OT/SLP Co-Evaluation/Treatment: Yes Reason for Co-Treatment: For patient/therapist safety;To address functional/ADL transfers;Necessary to address cognition/behavior during functional activity PT goals addressed during session: Balance;Mobility/safety with mobility OT goals addressed during session: ADL's and self-care      AM-PAC OT 6 Clicks Daily Activity     Outcome Measure   Help from another person eating meals?: A Little Help from another person taking care of personal grooming?: A Little Help from another person toileting, which includes using toliet, bedpan, or urinal?: A Lot Help from another person bathing (including washing, rinsing, drying)?: A Lot Help from another person to put on and taking off regular upper body clothing?: A Lot Help from another person to put on and taking off regular lower body clothing?: A Lot 6 Click Score: 14    End of Session Equipment Utilized During Treatment: Rolling walker (2 wheels)  OT Visit Diagnosis: Other abnormalities of gait  and mobility (R26.89);History of falling (Z91.81);Muscle  weakness (generalized) (M62.81);Pain;Other symptoms and signs involving cognitive function Pain - Right/Left: Left Pain - part of body:  (ribs)   Activity Tolerance Patient tolerated treatment well   Patient Left in chair;with call bell/phone within reach;with chair alarm set   Nurse Communication Mobility status        Time: 8498-8463 OT Time Calculation (min): 35 min  Charges: OT General Charges $OT Visit: 1 Visit OT Treatments $Self Care/Home Management : 8-22 mins  Kenner Lewan L. [place] Folden, OTR/L  09/19/24, 4:44 PM

## 2024-09-19 NOTE — Evaluation (Signed)
 Physical Therapy Evaluation Patient Details Name: Lance Diaz MRN: [id number] DOB: March 27, 1931 Today's Date: 09/19/2024  History of Present Illness  Pt is a 89 y.o. male admitted with L pneumothorax, L rib fractures following 2x unwitnessed falls at home. CT head and neck neg, X-ray of both wrist neg, X-ray of L-spine shows age indeterminate T12 compression fractures, L 7/10 rib fractures. PMH significant for HTN, HLD, diet-controlled diabetes, SSS (s/p of PPM), A-fib on Eliquis, GERD, BPH, CKD-3, carotid artery stenosis  Clinical Impression  Patient resting in bed, awake and interactive with therapist. Oriented to self; inconsistently follows simple, verbal commands.  Globally confused and easily distracted by external environment; frequent cuing for redirection to task and for overall processing/initiation of functional tasks. Bilat UE/LE strength and ROM generally weak and deconditioned (at least 3+ to 4-/5); no focal weakness appreciated.  Does demonstrate clinical indicators of pain (grimacing, guarding) to L flank/ribs, FACES 6/10; improved with rest and repositioning. Currently requiring mod assist for bed mobility (log rolling); min/mod assist +1-2 for sit/stand, basic transfers, standing balance and gait (120') with RW.  Demonstrates flexed posture with mod WBing on RW; inconsistent step height/length with crouched/flexed knees. Narrowed BOS, limited balance reactions.  Recommend use of RW and +1 at all times for safety. Would benefit from skilled PT to address above deficits and promote optimal return to PLOF.; recommend post-acute PT follow up as indicated by interdisciplinary care team.          If plan is discharge home, recommend the following: Two people to help with bathing/dressing/bathroom;Two people to help with walking and/or transfers   Can travel by private vehicle        Equipment Recommendations    Recommendations for Other Services       Functional Status  Assessment Patient has had a recent decline in their functional status and demonstrates the ability to make significant improvements in function in a reasonable and predictable amount of time.     Precautions / Restrictions Precautions Precautions: Fall Precaution/Restrictions Comments: L rib fx, T12 compression fx (no brace needed per MD Laurita) Restrictions Weight Bearing Restrictions Per Provider Order: No      Mobility  Bed Mobility Overal bed mobility: Needs Assistance Bed Mobility: Rolling, Supine to Sit Rolling: Mod assist Sidelying to sit: Mod assist       General bed mobility comments: step by step cuing for log roll technique    Transfers Overall transfer level: Needs assistance Equipment used: Rolling walker (2 wheels) Transfers: Sit to/from Stand Sit to Stand: Min assist, +2 physical assistance Stand pivot transfers: Min assist, +2 physical assistance              Ambulation/Gait Ambulation/Gait assistance: Min assist, +2 safety/equipment Gait Distance (Feet): 120 Feet Assistive device: Rolling walker (2 wheels)         General Gait Details: flexed posture with mod WBing on RW; inconsistent step height/length with crouched/flexed knees. Narrowed BOS, limited balance reactions.  Recommend use of RW and +1  Stairs            Wheelchair Mobility     Tilt Bed    Modified Rankin (Stroke Patients Only)       Balance Overall balance assessment: Needs assistance Sitting-balance support: No upper extremity supported, Feet supported Sitting balance-Leahy Scale: Fair     Standing balance support: During functional activity, Bilateral upper extremity supported, Reliant on assistive device for balance Standing balance-Leahy Scale: Fair  Pertinent Vitals/Pain Pain Assessment Pain Assessment: Faces Faces Pain Scale: Hurts even more Pain Location: L ribs with movement, back Pain Descriptors /  Indicators: Grimacing, Guarding, Dull Pain Intervention(s): Limited activity within patient's tolerance, Monitored during session, Premedicated before session, Repositioned    Home Living Family/patient expects to be discharged to:: Private residence Living Arrangements: Spouse/significant other Available Help at Discharge: Family;Available PRN/intermittently Type of Home: House Home Access: Stairs to enter   Entrance Stairs-Number of Steps: 2   Home Layout: One level Home Equipment: Agricultural consultant (2 wheels)      Prior Function Prior Level of Function : Patient poor historian/Family not available;History of Falls (last six months);Driving             Mobility Comments: Per chart, patient ambulatory without assist device; + driving, caregiving for wife       Extremity/Trunk Assessment   Upper Extremity Assessment Upper Extremity Assessment: Generalized weakness    Lower Extremity Assessment Lower Extremity Assessment: Generalized weakness (grossly at least 3+ to 4-/5 throughout)       Communication   Communication Communication: Impaired Factors Affecting Communication: Hearing impaired    Cognition Arousal: Alert Behavior During Therapy: WFL for tasks assessed/performed                           PT - Cognition Comments: Oriented to self only; follows simple commands with increased time for processing Following commands: Impaired Following commands impaired: Follows one step commands with increased time, Follows multi-step commands with increased time     Cueing Cueing Techniques: Verbal cues, Gestural cues, Tactile cues     General Comments      Exercises Other Exercises Other Exercises: Toilet transfer SPT with RW, min/mod assist; step-by-step cuing for task comprehension and sequencing.  Easily distracted/confused with multi-step tasks   Assessment/Plan    PT Assessment Patient needs continued PT services  PT Problem List Decreased  strength;Decreased range of motion;Decreased activity tolerance;Decreased knowledge of use of DME;Decreased mobility;Decreased balance;Decreased cognition;Decreased safety awareness       PT Treatment Interventions DME instruction;Gait training;Functional mobility training;Stair training;Patient/family education;Balance training;Therapeutic exercise;Therapeutic activities    PT Goals (Current goals can be found in the Care Plan section)  Acute Rehab PT Goals Patient Stated Goal: agreeable to session and OOB PT Goal Formulation: With patient Time For Goal Achievement: 10/03/24 Potential to Achieve Goals: Good    Frequency Min 2X/week     Co-evaluation PT/OT/SLP Co-Evaluation/Treatment: Yes Reason for Co-Treatment: For patient/therapist safety;To address functional/ADL transfers;Necessary to address cognition/behavior during functional activity PT goals addressed during session: Balance;Mobility/safety with mobility OT goals addressed during session: ADL's and self-care       AM-PAC PT 6 Clicks Mobility  Outcome Measure Help needed turning from your back to your side while in a flat bed without using bedrails?: A Lot Help needed moving from lying on your back to sitting on the side of a flat bed without using bedrails?: A Lot Help needed moving to and from a bed to a chair (including a wheelchair)?: A Lot Help needed standing up from a chair using your arms (e.g., wheelchair or bedside chair)?: A Lot Help needed to walk in hospital room?: A Lot Help needed climbing 3-5 steps with a railing? : A Lot 6 Click Score: 12    End of Session Equipment Utilized During Treatment: Gait belt Activity Tolerance: Patient tolerated treatment well Patient left: in chair;with call bell/phone within reach;with chair alarm set  Nurse Communication: Mobility status PT Visit Diagnosis: Muscle weakness (generalized) (M62.81);Difficulty in walking, not elsewhere classified (R26.2)    Time:  8495-8464 PT Time Calculation (min) (ACUTE ONLY): 31 min   Charges:   PT Evaluation $PT Eval Moderate Complexity: 1 Mod   PT General Charges $$ ACUTE PT VISIT: 1 Visit        Delesia Martinek H. Delores, PT, DPT, NCS 09/19/24, 10:06 PM [phone]

## 2024-09-19 NOTE — Progress Notes (Addendum)
 Mobility Specialist - Progress Note   09/19/24 1638  Mobility  Activity Ambulated with assistance  Level of Assistance Contact guard assist, steadying assist  Assistive Device Front wheel walker  Distance Ambulated (ft) 24 ft  Activity Response Tolerated well  Mobility visit 1 Mobility  Mobility Specialist Start Time (ACUTE ONLY) 1621  Mobility Specialist Stop Time (ACUTE ONLY) 1632  Mobility Specialist Time Calculation (min) (ACUTE ONLY) 11 min   Pt seated in the recliner upon entry, utilizing RA. Pt amb to/from the bathroom MinA-CGA and returned to bed--- VCs for RW management. Pt left supine with alarm set and needs within reach.   America Silvan Mobility Specialist 09/19/24 4:40 PM

## 2024-09-19 NOTE — Progress Notes (Signed)
 OT Cancellation Note  Patient Details Name: Lance Diaz MRN: [id number] DOB: [birth year]   Cancelled Treatment:    Reason Eval/Treat Not Completed: Fatigue/lethargy limiting ability to participate;Pain limiting ability to participate. Pt received asleep with son and granddaughter in room. Per family, pt with increased confusion / lethargy and is highly limited by pain with any mobility. Discussed with RN - will plan to see patient after pain meds for optimal therapeutic outcomes. OT will continue to follow and see as appropriate.   Elek Holderness L. Norma Ignasiak, OTR/L  09/19/24, 9:48 AM

## 2024-09-19 NOTE — TOC Progression Note (Signed)
 Transition of Care [facility]) - Progression Note    Patient Details  Name: Lance Diaz MRN: [id number] Date of Birth: [birth year]  [facility] CM/SW Contact  Corean ONEIDA Haddock, RN Phone Number: 09/19/2024, 3:25 PM  Clinical Narrative:      PT eval pending.  TOC will follow up on discission based on recs                    Expected Discharge Plan and Services                                               Social Drivers of Health (SDOH) Interventions SDOH Screenings   Food Insecurity: Unknown (09/17/2024)  Housing: Low Risk  (09/17/2024)  Transportation Needs: No Transportation Needs (09/17/2024)  Utilities: Not At Risk (09/17/2024)  Financial Resource Strain: Low Risk  (09/05/2024)   [facility]  Social Connections: Unknown (09/17/2024)  Tobacco Use: Medium Risk (09/16/2024)    Readmission Risk Interventions     No data to display

## 2024-09-19 NOTE — Plan of Care (Signed)
  Problem: Education: Goal: Ability to describe self-care measures that may prevent or decrease complications (Diabetes Survival Skills Education) will improve Outcome: Progressing Goal: Individualized Educational Video(s) Outcome: Progressing   Problem: Coping: Goal: Ability to adjust to condition or change in health will improve Outcome: Progressing   Problem: Fluid Volume: Goal: Ability to maintain a balanced intake and output will improve Outcome: Progressing   Problem: Health Behavior/Discharge Planning: Goal: Ability to identify and utilize available resources and services will improve Outcome: Progressing Goal: Ability to manage health-related needs will improve Outcome: Progressing   Problem: Metabolic: Goal: Ability to maintain appropriate glucose levels will improve Outcome: Progressing   Problem: Nutritional: Goal: Maintenance of adequate nutrition will improve Outcome: Progressing Goal: Progress toward achieving an optimal weight will improve Outcome: Progressing   Problem: Skin Integrity: Goal: Risk for impaired skin integrity will decrease Outcome: Progressing   Problem: Health Behavior/Discharge Planning: Goal: Ability to manage health-related needs will improve Outcome: Progressing   Problem: Clinical Measurements: Goal: Ability to maintain clinical measurements within normal limits will improve Outcome: Progressing Goal: Will remain free from infection Outcome: Progressing Goal: Diagnostic test results will improve Outcome: Progressing Goal: Respiratory complications will improve Outcome: Progressing Goal: Cardiovascular complication will be avoided Outcome: Progressing   Problem: Coping: Goal: Level of anxiety will decrease Outcome: Progressing   Problem: Nutrition: Goal: Adequate nutrition will be maintained Outcome: Progressing   Problem: Elimination: Goal: Will not experience complications related to bowel motility Outcome:  Progressing Goal: Will not experience complications related to urinary retention Outcome: Progressing   Problem: Safety: Goal: Ability to remain free from injury will improve Outcome: Progressing   Problem: Skin Integrity: Goal: Risk for impaired skin integrity will decrease Outcome: Progressing

## 2024-09-20 DIAGNOSIS — G9341 Metabolic encephalopathy: Secondary | ICD-10-CM | POA: Diagnosis not present

## 2024-09-20 DIAGNOSIS — J939 Pneumothorax, unspecified: Secondary | ICD-10-CM | POA: Diagnosis not present

## 2024-09-20 DIAGNOSIS — S2242XA Multiple fractures of ribs, left side, initial encounter for closed fracture: Secondary | ICD-10-CM | POA: Diagnosis not present

## 2024-09-20 LAB — BASIC METABOLIC PANEL WITH GFR
Anion gap: 9 (ref 5–15)
BUN: 41 mg/dL — ABNORMAL HIGH (ref 8–23)
CO2: 24 mmol/L (ref 22–32)
Calcium: 8.9 mg/dL (ref 8.9–10.3)
Chloride: 107 mmol/L (ref 98–111)
Creatinine, Ser: 1.2 mg/dL (ref 0.61–1.24)
GFR, Estimated: 57 mL/min — ABNORMAL LOW (ref >60)
Glucose, Bld: 134 mg/dL — ABNORMAL HIGH (ref 70–99)
Potassium: 4.2 mmol/L (ref 3.5–5.1)
Sodium: 140 mmol/L (ref 135–145)

## 2024-09-20 LAB — CBC
HCT: 29.8 % — ABNORMAL LOW (ref 39.0–52.0)
Hemoglobin: 9.9 g/dL — ABNORMAL LOW (ref 13.0–17.0)
MCH: 32.8 pg (ref 26.0–34.0)
MCHC: 33.2 g/dL (ref 30.0–36.0)
MCV: 98.7 fL (ref 80.0–100.0)
Platelets: 132 K/uL — ABNORMAL LOW (ref 150–400)
RBC: 3.02 MIL/uL — ABNORMAL LOW (ref 4.22–5.81)
RDW: 11.9 % (ref 11.5–15.5)
WBC: 8.3 K/uL (ref 4.0–10.5)
nRBC: 0 % (ref 0.0–0.2)

## 2024-09-20 NOTE — Plan of Care (Signed)

## 2024-09-20 NOTE — NC FL2 (Signed)
 [place]  MEDICAID FL2 LEVEL OF CARE FORM     IDENTIFICATION  Patient Name: Lance Diaz Birthdate: 21-Feb-1931 Sex: male Admission Date (Current Location): 09/16/2024  [facility] and [place] Number:  Chiropodist and Address:  [facility], [address] [place], [place] [address]      Provider Number: [id number]  Attending Physician Name and Address:  Lance Pillion, MD  Relative Name and Phone Number:  Lance Diaz [phone]    Current Level of Care: Hospital Recommended Level of Care: Skilled Nursing Facility Prior Approval Number:    Date Approved/Denied:   PASRR Number: [id number]  Discharge Plan: SNF    Current Diagnoses: Patient Active Problem List   Diagnosis Date Noted   Acute metabolic encephalopathy 09/18/2024   LLL pneumonia 09/18/2024   HLD (hyperlipidemia) 09/17/2024   Delirium 09/17/2024   Pneumothorax on left 09/16/2024   Chronic kidney disease, stage 3a (HCC) 09/16/2024   Fall at home, initial encounter 09/16/2024   Left rib fracture 09/16/2024   Leukocytosis 09/16/2024   Thrombocytopenia 09/16/2024   Normocytic anemia 09/16/2024   Benign prostatic hyperplasia with urinary obstruction 01/27/2021   Medicare annual wellness visit, initial 05/15/2018   Type 2 diabetes mellitus with diabetic nephropathy (HCC) 08/22/2017   Unilateral recurrent inguinal hernia without obstruction or gangrene    Raynaud's phenomenon without gangrene 11/09/2016   B12 deficiency 10/30/2016   Symptomatic bradycardia 07/04/2016   Paroxysmal A-fib (HCC) 06/19/2016   Sick sinus syndrome (HCC) 05/24/2016   Chronotropic incompetence with left ventricular dysfunction 05/24/2016   CKD (chronic kidney disease) stage 3, GFR 30-59 ml/min (HCC) 10/18/2015   Benign essential HTN 05/27/2015   Bilateral carotid artery stenosis 05/27/2015   Diabetes mellitus type 2, controlled, without complications (HCC) 04/14/2015   Iron deficiency  anemia due to chronic blood loss 10/14/2014   Esophagitis, reflux 04/08/2014   Hyperlipidemia, mixed 04/08/2014    Orientation RESPIRATION BLADDER Height & Weight     Self  Normal Continent Weight: 144 lb 2.9 oz (65.4 kg) Height:  6' 0.99 (185.4 cm)  BEHAVIORAL SYMPTOMS/MOOD NEUROLOGICAL BOWEL NUTRITION STATUS      Continent Diet (Diet heart healthy/carb modified Fluid consistency: Thin starting at 10/14 2249)  AMBULATORY STATUS COMMUNICATION OF NEEDS Skin   Limited Assist Verbally Normal, Other (Comment) (DRY)                       Personal Care Assistance Level of Assistance  Bathing, Feeding, Dressing Bathing Assistance: Limited assistance Feeding assistance: Limited assistance Dressing Assistance: Limited assistance     Functional Limitations Info  Sight, Hearing, Speech          SPECIAL CARE FACTORS FREQUENCY  PT (By licensed PT), OT (By licensed OT)     PT Frequency: 2x OT Frequency: 2x            Contractures Contractures Info: Not present    Additional Factors Info  Code Status, Allergies Code Status Info: DNR-LIMITED Allergies Info: Ace Inhibitors, Iodine, Tamsulosin           Current Medications (09/20/2024):  This is the current hospital active medication list Current Facility-Administered Medications  Medication Dose Route Frequency Provider Last Rate Last Admin   acetaminophen  (TYLENOL ) tablet 650 mg  650 mg Oral Q6H PRN Lance, Xilin, MD   650 mg at 09/18/24 0904   apixaban (ELIQUIS) tablet 2.5 mg  2.5 mg Oral BID Lance, Dekui, MD   2.5 mg at 09/20/24 [phone]  atorvastatin  (LIPITOR) tablet 20 mg  20 mg Oral Daily Lance, Xilin, MD   20 mg at 09/20/24 0821   cefUROXime (CEFTIN) tablet 250 mg  250 mg Oral BID WC Lance, Dekui, MD   250 mg at 09/20/24 0821   diltiazem (CARDIZEM SR) 12 hr capsule 90 mg  90 mg Oral Daily Lance, Xilin, MD   90 mg at 09/20/24 0821   ferrous gluconate (FERGON) tablet 324 mg  324 mg Oral QPM Lance, Xilin, MD   324 mg at  09/19/24 1649   hydrALAZINE (APRESOLINE) injection 5 mg  5 mg Intravenous Q2H PRN Lance, Xilin, MD       lidocaine  (LIDODERM ) 5 % 1 patch  1 patch Transdermal QHS Lance, Xilin, MD   1 patch at 09/19/24 2116   melatonin tablet 5 mg  5 mg Oral QHS Lance, Dekui, MD   5 mg at 09/19/24 2116   methocarbamol (ROBAXIN) tablet 500 mg  500 mg Oral Q8H PRN Lance, Xilin, MD   500 mg at 09/19/24 1109   ondansetron  (ZOFRAN ) injection 4 mg  4 mg Intravenous Q8H PRN Lance, Xilin, MD       oxyCODONE -acetaminophen  (PERCOCET/ROXICET) 5-325 MG per tablet 0.5 tablet  0.5 tablet Oral Q4H PRN Lance, Dekui, MD   0.5 tablet at 09/19/24 2245   pantoprazole (PROTONIX) EC tablet 40 mg  40 mg Oral Daily Lance, Xilin, MD   40 mg at 09/20/24 9178   QUEtiapine (SEROQUEL) tablet 12.5 mg  12.5 mg Oral QHS Lance, Dekui, MD   12.5 mg at 09/19/24 2116     Discharge Medications: Please see discharge summary for a list of discharge medications.  Relevant Imaging Results:  Relevant Lab Results:   Additional Information [id number]  Lance Diaz, [place]

## 2024-09-20 NOTE — Care Management Obs Status (Signed)
 MEDICARE OBSERVATION STATUS NOTIFICATION   Patient Details  Name: Lance Diaz MRN: [id number] Date of Birth: 05-Mar-1931   Medicare Observation Status Notification Given:  Yes (didn't document OBS on the 15th, documenting now)    [name] 09/20/2024, 2:05 PM

## 2024-09-20 NOTE — Plan of Care (Signed)

## 2024-09-20 NOTE — Progress Notes (Signed)
 Progress Note   Patient: Lance Diaz [id number] DOB: 02/20/1931 DOA: 09/16/2024     2 DOS: the patient was seen and examined on 09/20/2024   Brief hospital course: Lance Diaz is a 89 y.o. male with medical history significant of HTN, HLD, diet-controlled diabetes, SSS (s/p of PPM), A-fib on Eliquis, GERD, BPH, CKD-3, carotid artery stenosis, who presents with fall.  Patient also had some confusion while in the hospital.  He was recently given tramadol  for pain. He was found to have a left-sided rib fracture and a small left pneumothorax.   Principal Problem:   Pneumothorax on left Active Problems:   Left rib fracture   Fall at home, initial encounter   Benign essential HTN   Paroxysmal A-fib (HCC)   HLD (hyperlipidemia)   Type 2 diabetes mellitus with diabetic nephropathy (HCC)   Chronic kidney disease, stage 3a (HCC)   Leukocytosis   Thrombocytopenia   Normocytic anemia   Delirium   Acute metabolic encephalopathy   LLL pneumonia   Assessment and Plan: Pneumothorax on left: Left rib fracture. Mechanical fall. Pt has very small left apical pneumothorax on X-ray and Ct of c spin. No oxygen desaturation.   Chest pain is better controlled.  Repeated chest x-ray showed reduced pneumothorax.   Still complaining with pain in the chest and the left shoulder, will continue low-dose pain medicine.     Delirium likely due to head concussion and metabolic encephalopathy Acute metabolic encephalopathy secondary to pneumonia. Left lower lobe pneumonia. Two-view chest x-ray performed on 10/16 showed worsening left lower lobe infiltrates, consistent with pneumonia.  Most likely this of aspiration event, repeat chest x-ray today seem to be better.  I will obtain blood culture, started Rocephin.  Will finish 5 days of antibiotics.  Changed to oral antibiotics on 10/17. Patient had significant confusion, agitation.  This is most likely due to combination of brain concussion and  a metabolic encephalopathy.  Started Seroquel as well as melatonin. Patient has been sleeping well for the last 2 nights, no agitation, appetite has improved.  However, patient still has significant confusion.  I suspect patient has significant dementia. Patient has been seen by PT/OT, now recommended nursing home placement.   Pain in the left shoulder. X-ray showed degenerative changes without acute changes      Benign essential HTN On diltiazem.   Paroxysmal A-fib [facility]): Heart rate 70s Continue diltiazem, restarted Eliquis.   HLD (hyperlipidemia) -Lipid   Diet controlled Type 2 diabetes mellitus with diabetic nephropathy (HCC): Recent A1c 5.9, well-controlled.  Patient's not taking medications currently.     Chronic kidney disease, stage 3a (HCC): Renal function has back to baseline.   Thrombocytopenia B12 deficient anemia.:  Patient has a mild thrombocytopenia, which is improving.  Patient was chronically receiving B12 injection every month.          Subjective:  Patient slept well last night, he still has some confusion, no agitation.  He was able to maintain a conversation.  Physical Exam: Vitals:   09/19/24 1649 09/19/24 1945 09/20/24 0349 09/20/24 0923  BP: 129/60 129/71 (!) 144/63 136/66  Pulse: 66 61 66 64  Resp: 14 20 16 14   Temp: 98.5 F (36.9 C) 98.6 F (37 C) 98 F (36.7 C) 97.9 F (36.6 C)  TempSrc:  Oral Oral   SpO2: 99% 99% 98% 100%  Weight:      Height:       General exam: Appears calm and comfortable  Respiratory system:  Clear to auscultation. Respiratory effort normal. Cardiovascular system: S1 & S2 heard, RRR. No JVD, murmurs, rubs, gallops or clicks. No pedal edema. Gastrointestinal system: Abdomen is nondistended, soft and nontender. No organomegaly or masses felt. Normal bowel sounds heard. Central nervous system: Alert and oriented x1. No focal neurological deficits. Extremities: Symmetric 5 x 5 power. Skin: No rashes, lesions or  ulcers Psychiatry:  Mood & affect appropriate.    Data Reviewed:  X-ray reviewed, lab results reviewed.  Family Communication: Daughter updated over the phone  Disposition: Status is: Inpatient Remains inpatient appropriate because: Severity of disease, altered mental status     Time spent: 35 minutes  Author: Murvin Mana, MD 09/20/2024 10:09 AM  For on call review [email or web address].

## 2024-09-20 NOTE — Care Management Important Message (Signed)
 Important Message  Patient Details  Name: Lance Diaz MRN: [id number] Date of Birth: [birth year]   Important Message Given:  Yes - Medicare IM     [name] 09/20/2024, 2:03 PM

## 2024-09-21 ENCOUNTER — Inpatient Hospital Stay

## 2024-09-21 DIAGNOSIS — S2242XA Multiple fractures of ribs, left side, initial encounter for closed fracture: Secondary | ICD-10-CM | POA: Diagnosis not present

## 2024-09-21 DIAGNOSIS — G9341 Metabolic encephalopathy: Secondary | ICD-10-CM | POA: Diagnosis not present

## 2024-09-21 DIAGNOSIS — S270XXA Traumatic pneumothorax, initial encounter: Principal | ICD-10-CM

## 2024-09-21 MED ORDER — CEFUROXIME AXETIL 250 MG PO TABS: 250.0000 mg | ORAL_TABLET | Freq: Two times a day (BID) | ORAL | Status: DC

## 2024-09-21 MED ORDER — QUETIAPINE FUMARATE 25 MG PO TABS: 12.5000 mg | ORAL_TABLET | Freq: Every day | ORAL | Status: AC

## 2024-09-21 MED ORDER — METHOCARBAMOL 500 MG PO TABS: 500.0000 mg | ORAL_TABLET | Freq: Three times a day (TID) | ORAL | Status: AC | PRN

## 2024-09-21 MED ORDER — OXYCODONE-ACETAMINOPHEN 5-325 MG PO TABS: 0.5000 | ORAL_TABLET | Freq: Four times a day (QID) | ORAL | 0 refills | Status: AC | PRN

## 2024-09-21 MED ORDER — LIDOCAINE 5 % EX PTCH: 1.0000 | MEDICATED_PATCH | Freq: Every day | CUTANEOUS | Status: AC

## 2024-09-21 MED ORDER — MELATONIN 5 MG PO TABS: 5.0000 mg | ORAL_TABLET | Freq: Every day | ORAL | Status: AC

## 2024-09-21 NOTE — Progress Notes (Signed)
 Progress Note   Patient: Lance Diaz [id number] DOB: [birth year] DOA: 09/16/2024     3 DOS: the patient was seen and examined on 09/21/2024   Brief hospital course: Lance Diaz is a 89 y.o. male with medical history significant of HTN, HLD, diet-controlled diabetes, SSS (s/p of PPM), A-fib on Eliquis, GERD, BPH, CKD-3, carotid artery stenosis, who presents with fall.  Patient also had some confusion while in the hospital.  He was recently given tramadol  for pain. He was found to have a left-sided rib fracture and a small left pneumothorax.   Principal Problem:   Pneumothorax on left Active Problems:   Left rib fracture   Fall at home, initial encounter   Benign essential HTN   Paroxysmal A-fib (HCC)   HLD (hyperlipidemia)   Type 2 diabetes mellitus with diabetic nephropathy (HCC)   Chronic kidney disease, stage 3a (HCC)   Leukocytosis   Thrombocytopenia   Normocytic anemia   Delirium   Acute metabolic encephalopathy   LLL pneumonia   Assessment and Plan: Pneumothorax on left: Left rib fracture. Mechanical fall. Pt has very small left apical pneumothorax on X-ray and Ct of c spin. No oxygen desaturation.   Repeated chest x-ray on 10/19, still has very tiny pneumothorax.  Condition stable, no need for additional imaging.     Delirium likely due to head concussion and metabolic encephalopathy Acute metabolic encephalopathy secondary to pneumonia. Left lower lobe pneumonia. Two-view chest x-ray performed on 10/16 showed worsening left lower lobe infiltrates, consistent with pneumonia.  Most likely this of aspiration event, repeat chest x-ray today seem to be better.  I will obtain blood culture, started Rocephin.  Will finish 5 days of antibiotics.  Changed to oral antibiotics on 10/17. Patient had significant confusion, agitation.  This is most likely due to combination of brain concussion and a metabolic encephalopathy.  Started Seroquel as well as  melatonin. Patient has been sleeping well for the last 2 nights, no agitation, appetite has improved.  Patient mental status continues to improve, but still has some mild confusion. Patient has been seen by PT/OT, now recommended nursing home placement. Discussed with TOC, patient is pending authorization for nursing placement.   Pain in the left shoulder. X-ray showed degenerative changes without acute changes      Benign essential HTN On diltiazem.   Paroxysmal A-fib [facility]): Heart rate 70s Continue diltiazem, restarted Eliquis.   HLD (hyperlipidemia) -Lipid   Diet controlled Type 2 diabetes mellitus with diabetic nephropathy (HCC): Recent A1c 5.9, well-controlled.  Patient's not taking medications currently.     Chronic kidney disease, stage 3a (HCC): Renal function has back to baseline.   Thrombocytopenia B12 deficient anemia.:  Patient has a mild thrombocytopenia, which is improving.  Patient was chronically receiving B12 injection every month.             Subjective:  Patient mental state much improved.  Currently disorientated to time.  Appetite improved.  Slept well overnight  Physical Exam: Vitals:   09/20/24 2012 09/21/24 0331 09/21/24 0510 09/21/24 0821  BP: (!) 126/55 121/75  (!) 144/78  Pulse: 60 66  75  Resp: 16 16  20   Temp: 99 F (37.2 C) (!) 97.5 F (36.4 C)  97.7 F (36.5 C)  TempSrc:      SpO2: 99% 97%  100%  Weight:   67 kg   Height:       General exam: Appears calm and comfortable  Respiratory system: Clear to auscultation.  Respiratory effort normal. Cardiovascular system: S1 & S2 heard, RRR. No JVD, murmurs, rubs, gallops or clicks. No pedal edema. Gastrointestinal system: Abdomen is nondistended, soft and nontender. No organomegaly or masses felt. Normal bowel sounds heard. Central nervous system: Alert and oriented x2. No focal neurological deficits. Extremities: Symmetric 5 x 5 power. Skin: No rashes, lesions or ulcers Psychiatry:  Judgement and insight appear normal. Mood & affect appropriate.    Data Reviewed:  Reviewed chest x-ray results.  Family Communication: None  Disposition: Status is: Inpatient Remains inpatient appropriate because: Unsafe discharge, pending nursing home placement.     Time spent: 35 minutes  Author: Murvin Mana, MD 09/21/2024 9:36 AM  For on call review [email or web address].

## 2024-09-21 NOTE — Discharge Summary (Signed)
 Physician Discharge Summary   Patient: Lance Diaz MRN: [id number] DOB: 28-Nov-1931  Admit date:     09/16/2024  Discharge date: 09/21/24  Discharge Physician: Murvin Mana   PCP: Cleotilde Oneil FALCON, MD   Recommendations at discharge:   Follow-up with PCP in 1 week.  Discharge Diagnoses: Principal Problem:   Pneumothorax on left Active Problems:   Left rib fracture   Fall at home, initial encounter   Benign essential HTN   Paroxysmal A-fib (HCC)   HLD (hyperlipidemia)   Type 2 diabetes mellitus with diabetic nephropathy (HCC)   Chronic kidney disease, stage 3a (HCC)   Leukocytosis   Thrombocytopenia   Normocytic anemia   Delirium   Acute metabolic encephalopathy   LLL pneumonia  Resolved Problems:   * No resolved hospital problems. *  Hospital Course: ALMA MUEGGE is a 89 y.o. male with medical history significant of HTN, HLD, diet-controlled diabetes, SSS (s/p of PPM), A-fib on Eliquis, GERD, BPH, CKD-3, carotid artery stenosis, who presents with fall.  Patient also had some confusion while in the hospital.  He was recently given tramadol  for pain. He was found to have a left-sided rib fracture and a small left pneumothorax.  Assessment and Plan:  Pneumothorax on left: Left rib fracture. Mechanical fall. Pt has very small left apical pneumothorax on X-ray and Ct of c spin. No oxygen desaturation.   Repeated chest x-ray on 10/19, still has very tiny pneumothorax.  Condition stable, no need for additional imaging.     Delirium likely due to head concussion and metabolic encephalopathy Acute metabolic encephalopathy secondary to pneumonia. Left lower lobe pneumonia. Two-view chest x-ray performed on 10/16 showed worsening left lower lobe infiltrates, consistent with pneumonia.  Most likely this of aspiration event, repeat chest x-ray today seem to be better.  I will obtain blood culture, started Rocephin.  Will finish 5 days of antibiotics.  Changed to oral  antibiotics on 10/17. Patient had significant confusion, agitation.  This is most likely due to combination of brain concussion and a metabolic encephalopathy.  Started Seroquel as well as melatonin. Patient has been sleeping well for the last 2 nights, no agitation, appetite has improved.  Patient mental status continues to improve, but still has some mild confusion. Patient has been seen by PT/OT, now recommended nursing home placement.    Pain in the left shoulder. X-ray showed degenerative changes without acute changes      Benign essential HTN On diltiazem.   Paroxysmal A-fib [facility]): Heart rate 70s Continue diltiazem, restarted Eliquis.   HLD (hyperlipidemia) -Lipid   Diet controlled Type 2 diabetes mellitus with diabetic nephropathy (HCC): Recent A1c 5.9, well-controlled.  Patient's not taking medications currently.     Chronic kidney disease, stage 3a (HCC): Renal function has back to baseline.   Thrombocytopenia B12 deficient anemia.:  Patient has a mild thrombocytopenia, which is improving.  Patient was chronically receiving B12 injection every month.            Consultants: None Procedures performed: None  Disposition: Skilled nursing facility Diet recommendation:  Discharge Diet Orders (From admission, onward)     Start     Ordered   09/21/24 0000  Diet - low sodium heart healthy        09/21/24 1331           Cardiac diet DISCHARGE MEDICATION: Allergies as of 09/21/2024       Reactions   Ace Inhibitors Other (See Comments)   Headaches  Iodine Swelling   Topical    Tamsulosin Hives        Medication List     STOP taking these medications    clindamycin 1 % external solution Commonly known as: CLEOCIN T   traMADol  50 MG tablet Commonly known as: ULTRAM        TAKE these medications    acetaminophen  650 MG CR tablet Commonly known as: TYLENOL  Take 1,300 mg by mouth 2 (two) times daily in the am and at bedtime.SABRA   apixaban 2.5  MG Tabs tablet Commonly known as: ELIQUIS Take 2.5 mg by mouth every 12 (twelve) hours.   atorvastatin  20 MG tablet Commonly known as: LIPITOR Take 20 mg by mouth daily.   cefUROXime 250 MG tablet Commonly known as: CEFTIN Take 1 tablet (250 mg total) by mouth 2 (two) times daily with a meal for 2 days.   cyanocobalamin 1000 MCG tablet Commonly known as: VITAMIN B12 Take 1,000 mcg by mouth daily.   diltiazem 90 MG 12 hr capsule Commonly known as: CARDIZEM SR Take 90 mg by mouth daily.   ferrous gluconate 324 MG tablet Commonly known as: FERGON Take 324 mg by mouth every evening.   lidocaine  5 % Commonly known as: LIDODERM  Place 1 patch onto the skin at bedtime. Remove & Discard patch within 12 hours or as directed by MD   melatonin 5 MG Tabs Take 1 tablet (5 mg total) by mouth at bedtime.   methocarbamol 500 MG tablet Commonly known as: ROBAXIN Take 1 tablet (500 mg total) by mouth every 8 (eight) hours as needed for muscle spasms.   oxyCODONE -acetaminophen  5-325 MG tablet Commonly known as: PERCOCET/ROXICET Take 0.5 tablets by mouth every 6 (six) hours as needed for moderate pain (pain score 4-6).   pantoprazole 40 MG tablet Commonly known as: PROTONIX Take 40 mg by mouth daily.   QUEtiapine 25 MG tablet Commonly known as: SEROQUEL Take 0.5 tablets (12.5 mg total) by mouth at bedtime.   valACYclovir 1000 MG tablet Commonly known as: VALTREX Take 2,000 mg by mouth 2 (two) times daily as needed (outbreaks). What changed: Another medication with the same name was removed. Continue taking this medication, and follow the directions you see here.               Discharge Care Instructions  (From admission, onward)           Start     Ordered   09/21/24 0000  Discharge wound care:       Comments: Cleanse R hand and R elbow skin tears with NS, apply Xeroform gauze ([place] [phone]) to wound beds daily and secure with silicone foam or kerlix roll gauze  whichever is preferred/works best.  SOAK DRESSINGS IN NS IF ADHERED TO WOUND BED FOR ATRAUMATIC REMOVAL   09/21/24 1331            Follow-up Information     Cleotilde Oneil FALCON, MD Follow up in 1 week(s).   Specialty: Internal Medicine Contact information: [address] [place] [address] [phone]                Discharge Exam: Filed Weights   09/18/24 0500 09/19/24 0911 09/21/24 0510  Weight: 65.4 kg 65.4 kg 67 kg   General exam: Appears calm and comfortable  Respiratory system: Clear to auscultation. Respiratory effort normal. Cardiovascular system: S1 & S2 heard, RRR. No JVD, murmurs, rubs, gallops or clicks. No pedal edema. Gastrointestinal system: Abdomen is nondistended,  soft and nontender. No organomegaly or masses felt. Normal bowel sounds heard. Central nervous system: Alert and oriented x2. No focal neurological deficits. Extremities: Symmetric 5 x 5 power. Skin: No rashes, lesions or ulcers Psychiatry:  Mood & affect appropriate.    Condition at discharge: good  The results of significant diagnostics from this hospitalization (including imaging, microbiology, ancillary and laboratory) are listed below for reference.   Imaging Studies: DG Chest Port 1 View Result Date: 09/21/2024 CLINICAL DATA:  Pneumothorax. EXAM: PORTABLE CHEST 1 VIEW COMPARISON:  09/18/2024 FINDINGS: Tiny left apical pneumothorax persists, similar to prior. Prominent skin folds overlie the lower left lung. Right lung clear. The cardiopericardial silhouette is within normal limits for size. Left-sided permanent pacemaker again noted. Acute lower left rib fractures evident. IMPRESSION: Tiny left apical pneumothorax, similar to prior. Electronically Signed   By: Camellia Candle M.D.   On: 09/21/2024 08:52   DG Shoulder Left Result Date: 09/19/2024 EXAM: 1 VIEW XRAY OF THE LEFT SHOULDER 09/19/2024 01:43:43 PM COMPARISON: None available. CLINICAL HISTORY: Shoulder pain. Left shoulder  pain at deltoid area. Recent fall with rib fx. FINDINGS: BONES AND JOINTS: Glenohumeral joint is normally aligned. No acute fracture or dislocation. The [facility] joint shows mild degenerative changes. SOFT TISSUES: Left chest wall cardiac device in place. No abnormal calcifications. Visualized lung is unremarkable. IMPRESSION: 1. No acute findings. 2. Mild degenerative changes of the acromioclavicular joint. Electronically signed by: Donnice Mania MD 09/19/2024 09:06 PM EDT RP Workstation: [id number]   DG Chest 2 View Result Date: 09/18/2024 CLINICAL DATA:  Pneumothorax. EXAM: CHEST - 2 VIEW COMPARISON:  09/17/2024 FINDINGS: No significant change in the small left apical pneumothorax. Patient has left lower rib fractures. Aeration at the left lung base is slightly improved. Heart size is stable. Stable appearance of the left chest dual chamber cardiac pacemaker. Right lung is clear. There may be a small amount of pleural fluid at the posterior left lung base. Lucency along the lateral left chest is indeterminate and recommend attention on follow-up. IMPRESSION: 1. Small left hydropneumothorax. Pleural air at the left lung apex is not significantly changed. Indeterminate lucency along the lateral left chest as described. 2. Slightly improved aeration at the left lung base. 3. Multiple left rib fractures. Electronically Signed   By: Juliene Balder M.D.   On: 09/18/2024 09:39   DG Chest 2 View Result Date: 09/17/2024 EXAM: 2 VIEW(S) XRAY OF THE CHEST 09/17/2024 11:32:43 AM COMPARISON: 09/17/2024 CLINICAL HISTORY: Pneumothorax FINDINGS: LINES, TUBES AND DEVICES: Left chest pacemaker with leads overlying right atrium and right ventricle, stable. LUNGS AND PLEURA: Increased left basilar patchy opacities. No pulmonary edema. Increased small left pleural effusion. Persistent trace left apical pneumothorax, stable. HEART AND MEDIASTINUM: Aortic arch atherosclerosis. No acute abnormality of the cardiac silhouette. BONES AND SOFT  TISSUES: Left rib fractures again noted. IMPRESSION: 1. Increased left basilar patchy opacities and small left pleural effusion. 2. Persistent trace left apical pneumothorax, stable. 3. Left rib fractures. Electronically signed by: Donnice Mania MD 09/17/2024 03:29 PM EDT RP Workstation: [id number]   DG Chest Port 1 View Result Date: 09/17/2024 EXAM: 1 VIEW(S) XRAY OF THE CHEST 09/17/2024 06:03:00 AM COMPARISON: 09/16/2024 CLINICAL HISTORY: Pneumothorax on left. FINDINGS: LINES, TUBES AND DEVICES: Left chest pacemaker with leads overlying right atrium and right ventricle. LUNGS AND PLEURA: Improved, now trace left apical pneumothorax (2-3 mm of pleural-parenchymal separation compared to prior study). No focal pulmonary opacity. No pulmonary edema. No pleural effusion. HEART AND MEDIASTINUM: Aortic arch atherosclerosis. No  acute abnormality of the cardiac and mediastinal silhouettes. BONES AND SOFT TISSUES: Left rib fractures. IMPRESSION: 1. Slightly decreased left apical pneumothorax. 2. Left rib fractures. Electronically signed by: Donnice Mania MD 09/17/2024 10:12 AM EDT RP Workstation: [id number]   DG Elbow 2 Views Right Result Date: 09/16/2024 EXAM: 2 VIEW(S) XRAY OF THE RIGHT ELBOW COMPARISON: None available. CLINICAL HISTORY: fall. FINDINGS: BONES AND JOINTS: No acute fracture. Olecranon spur. No joint dislocation. No joint effusion. SOFT TISSUES: The soft tissues are unremarkable. IMPRESSION: 1. No acute osseous abnormality Electronically signed by: Pinkie Pebbles MD 09/16/2024 11:23 PM EDT RP Workstation: [id number]   DG Wrist Complete Right Result Date: 09/16/2024 EXAM: 4 VIEW(S) XRAY OF THE RIGHT WRIST 09/16/2024 11:18:00 PM COMPARISON: None available. CLINICAL HISTORY: fall. The reason for exam is stated as fall. FINDINGS: BONES AND JOINTS: No acute fracture. No focal osseous lesion. No joint dislocation. SOFT TISSUES: The soft tissues are unremarkable. Vascular calcifications. IMPRESSION: 1.  No acute fracture or dislocation. Electronically signed by: Pinkie Pebbles MD 09/16/2024 11:21 PM EDT RP Workstation: [id number]   DG Wrist Complete Left Result Date: 09/16/2024 EXAM: 4 VIEW(S) XRAY OF THE LEFT WRIST 09/16/2024 11:18:00 PM COMPARISON: None available. CLINICAL HISTORY: Fall. FINDINGS: BONES AND JOINTS: No acute fracture. No focal osseous lesion. No joint dislocation. SOFT TISSUES: The soft tissues are unremarkable. IMPRESSION: 1. No significant abnormality. Electronically signed by: Pinkie Pebbles MD 09/16/2024 11:20 PM EDT RP Workstation: [id number]   DG Ribs Unilateral W/Chest Left Result Date: 09/16/2024 EXAM: AP VIEW(S) XRAY OF THE LEFT RIBS AND CHEST 09/16/2024 08:18:00 PM COMPARISON: Chest x-ray dated 05/24/2016. CLINICAL HISTORY: cp. Pt comes via EMS from home with c/o fall. Pt states he did hit his head. Pt states chest to daughter and she was worried bc he has pacemaker. Pt has skin tear to right hand and right elbow. Pt has hematoma to back of head. Pt is on thinner. Pt also ; states left side pain and back pain. FINDINGS: BONES: Acute displaced left lateral 7, 8, 9, 10 rib fractures. LUNGS AND PLEURA: No consolidation or pulmonary edema. No pleural effusion. No radiographic evidence of significant pneumothorax. Trace left apical pneumothorax. HEART AND MEDIASTINUM: Atherosclerotic plaque. Left chest wall pacemaker. No acute abnormality of the cardiac and mediastinal silhouettes. IMPRESSION: 1. Acute displaced left lateral 7th10th rib fractures. 2. Trace left apical pneumothorax. Finding better evaluated on CT c-spine 09/16/24. Electronically signed by: Morgane Naveau MD 09/16/2024 08:26 PM EDT RP Workstation: [id number]   DG Lumbar Spine 2-3 Views Result Date: 09/16/2024 EXAM: 2 or 3 VIEW(S) XRAY OF THE LUMBAR SPINE 09/16/2024 08:18:00 PM COMPARISON: CT renal dated 08/14/2015. CLINICAL HISTORY: cp. Pt comes via EMS from home with c/o fall. Pt states he did hit his head. Pt  has skin tear to right hand and right elbow. Pt is on thinner. Pt also ; states left side pain and back pain. FINDINGS: LUMBAR SPINE: BONES: Interval development of age-indeterminate T12 compression fracture. No acute fracture of the lumbar spine. Stable Mild retrolisthesis of L2 and L3. Atherosclerotic plaque. Safety pin noted overlying the iliac bone on lateral view which is likely external to patient. At least moderate intervertebral disc space narrowing at the L5-S1 level. No aggressive appearing osseous lesion. Limited evaluation due to overlapping osseous structures and overlying soft tissues. DISCS AND DEGENERATIVE CHANGES: Multilevel moderate degenerative change in the spine. At least mild osseous neural foraminal stenosis at the L4-L5 and L5-S1 levels. SOFT TISSUES: No acute abnormality. IMPRESSION: 1. No acute fracture of  the lumbar spine. 2. Interval development of age indeterminate T12 compression fracture. Limited evaluation due to overlapping osseous structures and overlying soft tissues. Correlate with point tenderness to palpation to evaluate for an acute component. Electronically signed by: Morgane Naveau MD 09/16/2024 08:24 PM EDT RP Workstation: [id number]   CT Head Wo Contrast Result Date: 09/16/2024 EXAM: CT HEAD AND CERVICAL SPINE 09/16/2024 08:03:46 PM TECHNIQUE: CT of the head and cervical spine was performed without the administration of intravenous contrast. Multiplanar reformatted images are provided for review. Automated exposure control, iterative reconstruction, and/or weight based adjustment of the mA/kV was utilized to reduce the radiation dose to as low as reasonably achievable. COMPARISON: None available. CLINICAL HISTORY: Fall. Pt comes via EMS from home with c/o fall. Pt states he did hit his head. Pt states chest to daughter and she was worried bc he has pacemaker. Pt has skin tear to right hand and right elbow. Pt has hematoma to back of head. Pt is on thinner. Pt also  states left side pain and back pain. FINDINGS: CT HEAD BRAIN AND VENTRICLES: No acute intracranial hemorrhage. No mass effect or midline shift. No abnormal extra-axial fluid collection. No evidence of acute infarct. No hydrocephalus. Global cortical atrophy. Subcortical and periventricular small vessel ischemic changes. Intracranial atherosclerosis. ORBITS: No acute abnormality. SINUSES AND MASTOIDS: No acute abnormality. SOFT TISSUES AND SKULL: No acute skull fracture. Hematoma to the back of the head. CT CERVICAL SPINE BONES AND ALIGNMENT: No acute fracture or traumatic malalignment. DEGENERATIVE CHANGES: Mild degenerative changes of the mid cervical spine, most prominent at C5-C6 and C6-C7. SOFT TISSUES: No prevertebral soft tissue swelling. LUNGS: Small left apical pneumothorax. IMPRESSION: 1. No acute intracranial abnormality. 2. No traumatic injury to the cervical spine. 3. Small left apical pneumothorax. Critical Value/emergent results were called by telephone at the time of interpretation on 09/16/2024 at 2010 hrs to provider Dr Ernest. Electronically signed by: Pinkie Pebbles MD 09/16/2024 08:11 PM EDT RP Workstation: [id number]   CT Cervical Spine Wo Contrast Result Date: 09/16/2024 EXAM: CT HEAD AND CERVICAL SPINE 09/16/2024 08:03:46 PM TECHNIQUE: CT of the head and cervical spine was performed without the administration of intravenous contrast. Multiplanar reformatted images are provided for review. Automated exposure control, iterative reconstruction, and/or weight based adjustment of the mA/kV was utilized to reduce the radiation dose to as low as reasonably achievable. COMPARISON: None available. CLINICAL HISTORY: Fall. Pt comes via EMS from home with c/o fall. Pt states he did hit his head. Pt states chest to daughter and she was worried bc he has pacemaker. Pt has skin tear to right hand and right elbow. Pt has hematoma to back of head. Pt is on thinner. Pt also states left side pain and back  pain. FINDINGS: CT HEAD BRAIN AND VENTRICLES: No acute intracranial hemorrhage. No mass effect or midline shift. No abnormal extra-axial fluid collection. No evidence of acute infarct. No hydrocephalus. Global cortical atrophy. Subcortical and periventricular small vessel ischemic changes. Intracranial atherosclerosis. ORBITS: No acute abnormality. SINUSES AND MASTOIDS: No acute abnormality. SOFT TISSUES AND SKULL: No acute skull fracture. Hematoma to the back of the head. CT CERVICAL SPINE BONES AND ALIGNMENT: No acute fracture or traumatic malalignment. DEGENERATIVE CHANGES: Mild degenerative changes of the mid cervical spine, most prominent at C5-C6 and C6-C7. SOFT TISSUES: No prevertebral soft tissue swelling. LUNGS: Small left apical pneumothorax. IMPRESSION: 1. No acute intracranial abnormality. 2. No traumatic injury to the cervical spine. 3. Small left apical pneumothorax. Critical Value/emergent results were called  by telephone at the time of interpretation on 09/16/2024 at 2010 hrs to provider Dr Ernest. Electronically signed by: Pinkie Pebbles MD 09/16/2024 08:11 PM EDT RP Workstation: [id number]    Microbiology: Results for orders placed or performed during the hospital encounter of 09/16/24  Culture, blood (Routine X 2) w Reflex to ID Panel     Status: None (Preliminary result)   Collection Time: 09/18/24  9:30 AM   Specimen: BLOOD  Result Value Ref Range Status   Specimen Description BLOOD left wrist  Final   Special Requests   Final    BOTTLES DRAWN AEROBIC AND ANAEROBIC Blood Culture adequate volume   Culture   Final    NO GROWTH 3 DAYS Performed at [facility], [address] [place], [place] [address]    Report Status PENDING  Incomplete  Culture, blood (Routine X 2) w Reflex to ID Panel     Status: None (Preliminary result)   Collection Time: 09/18/24  9:30 AM   Specimen: BLOOD  Result Value Ref Range Status   Specimen Description BLOOD RIFHT FA  Final   Special  Requests   Final    BOTTLES DRAWN AEROBIC AND ANAEROBIC Blood Culture adequate volume   Culture   Final    NO GROWTH 3 DAYS Performed at [facility], [address] [place], [place] [address]    Report Status PENDING  Incomplete    Labs: CBC: Recent Labs  Lab 09/16/24 1849 09/17/24 0155 09/20/24 0311  WBC 12.3* 9.6 8.3  HGB 10.7* 9.9* 9.9*  HCT 32.4* 29.7* 29.8*  MCV 100.0 98.3 98.7  PLT 125* 132* 132*   Basic Metabolic Panel: Recent Labs  Lab 09/16/24 1849 09/17/24 0155 09/18/24 0408 09/20/24 0311  NA 142 142 141 140  K 4.9 4.5 4.7 4.2  CL 109 109 109 107  CO2 22 21* 24 24  GLUCOSE 123* 108* 109* 134*  BUN 46* 42* 38* 41*  CREATININE 1.98* 1.94* 1.55* 1.20  CALCIUM  9.6 9.1 8.9 8.9  MG  --   --  2.3  --    Liver Function Tests: Recent Labs  Lab 09/16/24 1849  AST 25  ALT 27  ALKPHOS 47  BILITOT 0.7  PROT 7.4  ALBUMIN 4.0   CBG: Recent Labs  Lab 09/19/24 0908  GLUCAP 144*    Discharge time spent: 35 minutes.  Signed: Murvin Mana, MD [facility] 09/21/2024

## 2024-09-21 NOTE — Plan of Care (Signed)

## 2024-09-21 NOTE — TOC Progression Note (Addendum)
 Transition of Care [facility]) - Progression Note    Patient Details  Name: EWART CARRERA MRN: [id number] Date of Birth: July 15, 1931  [facility] CM/SW Contact  Victory Jackquline RAMAN, RN Phone Number: 09/21/2024, 1:24 PM  Clinical Narrative:   RNCM, spoke with the patient's granddaughter.  I introduced myself, my role, and explained that discharge planning recommendations would be discussed. PT recommended Short Term Rehab and the is in agreement and would like for her grandfather to go to [facility]. Patient has been accepted by [facility] RM# [id number]. They will accept him today if medically stable for discharge. MD and RN made aware.  RNCM will continue to follow discharge for planning/care coordination and update as applicable.   1:42pm: RNCM notified via secure chat by nurse that patient has a sitter at bedside. Advised that patient must be without a sitter for 24 hours prior to discharge to SNF. MD made aware and discontinuing the sitter. Patient will be discharged tomorrow to [facility].                     Expected Discharge Plan and Services                                               Social Drivers of Health (SDOH) Interventions SDOH Screenings   Food Insecurity: Unknown (09/17/2024)  Housing: Low Risk  (09/17/2024)  Transportation Needs: No Transportation Needs (09/17/2024)  Utilities: Not At Risk (09/17/2024)  Financial Resource Strain: Low Risk  (09/05/2024)   Received from [facility]  Social Connections: Unknown (09/17/2024)  Tobacco Use: Medium Risk (09/16/2024)    Readmission Risk Interventions     No data to display

## 2024-09-21 NOTE — Plan of Care (Signed)

## 2024-09-22 DIAGNOSIS — S2242XA Multiple fractures of ribs, left side, initial encounter for closed fracture: Secondary | ICD-10-CM | POA: Diagnosis not present

## 2024-09-22 DIAGNOSIS — G9341 Metabolic encephalopathy: Secondary | ICD-10-CM | POA: Diagnosis not present

## 2024-09-22 DIAGNOSIS — S270XXA Traumatic pneumothorax, initial encounter: Secondary | ICD-10-CM | POA: Diagnosis not present

## 2024-09-22 LAB — GLUCOSE, CAPILLARY: Glucose-Capillary: 101 mg/dL — ABNORMAL HIGH (ref 70–99)

## 2024-09-22 NOTE — Plan of Care (Signed)

## 2024-09-22 NOTE — Discharge Summary (Signed)
 Physician Discharge Summary   Patient: Lance Diaz MRN: [id number] DOB: 29-May-1931  Admit date:     09/16/2024  Discharge date: 09/22/24  Discharge Physician: Murvin Mana   PCP: Lance Oneil FALCON, MD   Recommendations at discharge:   Follow-up with PCP in 1 week  Discharge Diagnoses: Principal Problem:   Pneumothorax on left Active Problems:   Left rib fracture   Fall at home, initial encounter   Benign essential HTN   Paroxysmal A-fib (HCC)   HLD (hyperlipidemia)   Type 2 diabetes mellitus with diabetic nephropathy (HCC)   Chronic kidney disease, stage 3a (HCC)   Leukocytosis   Thrombocytopenia   Normocytic anemia   Delirium   Acute metabolic encephalopathy   LLL pneumonia   Pneumothorax, traumatic  Resolved Problems:   * No resolved hospital problems. *  Hospital Course: Lance Diaz is a 89 y.o. male with medical history significant of HTN, HLD, diet-controlled diabetes, SSS (s/p of PPM), A-fib on Eliquis, GERD, BPH, CKD-3, carotid artery stenosis, who presents with fall.  Patient also had some confusion while in the hospital.  He was recently given tramadol  for pain. He was found to have a left-sided rib fracture and a small left pneumothorax.  Assessment and Plan: Pneumothorax on left: Left rib fracture. Mechanical fall. Pt has very small left apical pneumothorax on X-ray and Ct of c spin. No oxygen desaturation.   Repeated chest x-ray on 10/19, still has very tiny pneumothorax.  Condition stable, no need for additional imaging.     Delirium likely due to head concussion and metabolic encephalopathy Acute metabolic encephalopathy secondary to pneumonia. Left lower lobe pneumonia. Two-view chest x-ray performed on 10/16 showed worsening left lower lobe infiltrates, consistent with pneumonia.  Most likely this of aspiration event, repeat chest x-ray today seem to be better.  I will obtain blood culture, started Rocephin.  Will finish 5 days of antibiotics.   Changed to oral antibiotics on 10/17. Patient had significant confusion, agitation.  This is most likely due to combination of brain concussion and metabolic encephalopathy.  Started Seroquel as well as melatonin. Patient has completed antibiotics today, mental status has improved.  Now he is alert and oriented x 3.  Medically stable for discharge.     Pain in the left shoulder. X-ray showed degenerative changes without acute changes      Benign essential HTN On diltiazem.   Paroxysmal A-fib [facility]): Heart rate 70s Continue diltiazem, restarted Eliquis.   HLD (hyperlipidemia) -Lipid   Diet controlled Type 2 diabetes mellitus with diabetic nephropathy (HCC): Recent A1c 5.9, well-controlled.  Patient's not taking medications currently.     Chronic kidney disease, stage 3a (HCC): Renal function has back to baseline.   Thrombocytopenia B12 deficient anemia.:  Patient has a mild thrombocytopenia, which is improving.  Patient was chronically receiving B12 injection every month.                   Consultants: None Procedures performed: None  Disposition: Skilled nursing facility Diet recommendation:  Discharge Diet Orders (From admission, onward)     Start     Ordered   09/21/24 0000  Diet - low sodium heart healthy        09/21/24 1331           Cardiac diet DISCHARGE MEDICATION: Allergies as of 09/22/2024       Reactions   Ace Inhibitors Other (See Comments)   Headaches   Iodine Swelling   Topical  Tamsulosin Hives        Medication List     STOP taking these medications    clindamycin 1 % external solution Commonly known as: CLEOCIN T   traMADol  50 MG tablet Commonly known as: ULTRAM        TAKE these medications    acetaminophen  650 MG CR tablet Commonly known as: TYLENOL  Take 1,300 mg by mouth 2 (two) times daily in the am and at bedtime.SABRA   apixaban 2.5 MG Tabs tablet Commonly known as: ELIQUIS Take 2.5 mg by mouth every 12  (twelve) hours.   atorvastatin  20 MG tablet Commonly known as: LIPITOR Take 20 mg by mouth daily.   cyanocobalamin 1000 MCG tablet Commonly known as: VITAMIN B12 Take 1,000 mcg by mouth daily.   diltiazem 90 MG 12 hr capsule Commonly known as: CARDIZEM SR Take 90 mg by mouth daily.   ferrous gluconate 324 MG tablet Commonly known as: FERGON Take 324 mg by mouth every evening.   lidocaine  5 % Commonly known as: LIDODERM  Place 1 patch onto the skin at bedtime. Remove & Discard patch within 12 hours or as directed by MD   melatonin 5 MG Tabs Take 1 tablet (5 mg total) by mouth at bedtime.   methocarbamol 500 MG tablet Commonly known as: ROBAXIN Take 1 tablet (500 mg total) by mouth every 8 (eight) hours as needed for muscle spasms.   oxyCODONE -acetaminophen  5-325 MG tablet Commonly known as: PERCOCET/ROXICET Take 0.5 tablets by mouth every 6 (six) hours as needed for moderate pain (pain score 4-6).   pantoprazole 40 MG tablet Commonly known as: PROTONIX Take 40 mg by mouth daily.   QUEtiapine 25 MG tablet Commonly known as: SEROQUEL Take 0.5 tablets (12.5 mg total) by mouth at bedtime.   valACYclovir 1000 MG tablet Commonly known as: VALTREX Take 2,000 mg by mouth 2 (two) times daily as needed (outbreaks). What changed: Another medication with the same name was removed. Continue taking this medication, and follow the directions you see here.               Discharge Care Instructions  (From admission, onward)           Start     Ordered   09/21/24 0000  Discharge wound care:       Comments: Cleanse R hand and R elbow skin tears with NS, apply Xeroform gauze (Lawson [phone]) to wound beds daily and secure with silicone foam or kerlix roll gauze whichever is preferred/works best.  SOAK DRESSINGS IN NS IF ADHERED TO WOUND BED FOR ATRAUMATIC REMOVAL   09/21/24 1331            Follow-up Information     Lance Oneil FALCON, MD Follow up in 1 week(s).    Specialty: Internal Medicine Contact information: [address] [place] [address] [phone]                Discharge Exam: Filed Weights   09/18/24 0500 09/19/24 0911 09/21/24 0510  Weight: 65.4 kg 65.4 kg 67 kg   General exam: Appears calm and comfortable  Respiratory system: Clear to auscultation. Respiratory effort normal. Cardiovascular system: S1 & S2 heard, RRR. No JVD, murmurs, rubs, gallops or clicks. No pedal edema. Gastrointestinal system: Abdomen is nondistended, soft and nontender. No organomegaly or masses felt. Normal bowel sounds heard. Central nervous system: Alert and oriented. No focal neurological deficits. Extremities: Symmetric 5 x 5 power. Skin: No rashes, lesions or ulcers Psychiatry: Judgement  and insight appear normal. Mood & affect appropriate.    Condition at discharge: good  The results of significant diagnostics from this hospitalization (including imaging, microbiology, ancillary and laboratory) are listed below for reference.   Imaging Studies: DG Chest Port 1 View Result Date: 09/21/2024 CLINICAL DATA:  Pneumothorax. EXAM: PORTABLE CHEST 1 VIEW COMPARISON:  09/18/2024 FINDINGS: Tiny left apical pneumothorax persists, similar to prior. Prominent skin folds overlie the lower left lung. Right lung clear. The cardiopericardial silhouette is within normal limits for size. Left-sided permanent pacemaker again noted. Acute lower left rib fractures evident. IMPRESSION: Tiny left apical pneumothorax, similar to prior. Electronically Signed   By: Camellia Candle M.D.   On: 09/21/2024 08:52   DG Shoulder Left Result Date: 09/19/2024 EXAM: 1 VIEW XRAY OF THE LEFT SHOULDER 09/19/2024 01:43:43 PM COMPARISON: None available. CLINICAL HISTORY: Shoulder pain. Left shoulder pain at deltoid area. Recent fall with rib fx. FINDINGS: BONES AND JOINTS: Glenohumeral joint is normally aligned. No acute fracture or dislocation. The [facility] joint shows mild  degenerative changes. SOFT TISSUES: Left chest wall cardiac device in place. No abnormal calcifications. Visualized lung is unremarkable. IMPRESSION: 1. No acute findings. 2. Mild degenerative changes of the acromioclavicular joint. Electronically signed by: Donnice Mania MD 09/19/2024 09:06 PM EDT RP Workstation: [id number]   DG Chest 2 View Result Date: 09/18/2024 CLINICAL DATA:  Pneumothorax. EXAM: CHEST - 2 VIEW COMPARISON:  09/17/2024 FINDINGS: No significant change in the small left apical pneumothorax. Patient has left lower rib fractures. Aeration at the left lung base is slightly improved. Heart size is stable. Stable appearance of the left chest dual chamber cardiac pacemaker. Right lung is clear. There may be a small amount of pleural fluid at the posterior left lung base. Lucency along the lateral left chest is indeterminate and recommend attention on follow-up. IMPRESSION: 1. Small left hydropneumothorax. Pleural air at the left lung apex is not significantly changed. Indeterminate lucency along the lateral left chest as described. 2. Slightly improved aeration at the left lung base. 3. Multiple left rib fractures. Electronically Signed   By: Juliene Balder M.D.   On: 09/18/2024 09:39   DG Chest 2 View Result Date: 09/17/2024 EXAM: 2 VIEW(S) XRAY OF THE CHEST 09/17/2024 11:32:43 AM COMPARISON: 09/17/2024 CLINICAL HISTORY: Pneumothorax FINDINGS: LINES, TUBES AND DEVICES: Left chest pacemaker with leads overlying right atrium and right ventricle, stable. LUNGS AND PLEURA: Increased left basilar patchy opacities. No pulmonary edema. Increased small left pleural effusion. Persistent trace left apical pneumothorax, stable. HEART AND MEDIASTINUM: Aortic arch atherosclerosis. No acute abnormality of the cardiac silhouette. BONES AND SOFT TISSUES: Left rib fractures again noted. IMPRESSION: 1. Increased left basilar patchy opacities and small left pleural effusion. 2. Persistent trace left apical  pneumothorax, stable. 3. Left rib fractures. Electronically signed by: Donnice Mania MD 09/17/2024 03:29 PM EDT RP Workstation: [id number]   DG Chest Port 1 View Result Date: 09/17/2024 EXAM: 1 VIEW(S) XRAY OF THE CHEST 09/17/2024 06:03:00 AM COMPARISON: 09/16/2024 CLINICAL HISTORY: Pneumothorax on left. FINDINGS: LINES, TUBES AND DEVICES: Left chest pacemaker with leads overlying right atrium and right ventricle. LUNGS AND PLEURA: Improved, now trace left apical pneumothorax (2-3 mm of pleural-parenchymal separation compared to prior study). No focal pulmonary opacity. No pulmonary edema. No pleural effusion. HEART AND MEDIASTINUM: Aortic arch atherosclerosis. No acute abnormality of the cardiac and mediastinal silhouettes. BONES AND SOFT TISSUES: Left rib fractures. IMPRESSION: 1. Slightly decreased left apical pneumothorax. 2. Left rib fractures. Electronically signed by: Donnice Mania MD 09/17/2024  10:12 AM EDT RP Workstation: [id number]   DG Elbow 2 Views Right Result Date: 09/16/2024 EXAM: 2 VIEW(S) XRAY OF THE RIGHT ELBOW COMPARISON: None available. CLINICAL HISTORY: fall. FINDINGS: BONES AND JOINTS: No acute fracture. Olecranon spur. No joint dislocation. No joint effusion. SOFT TISSUES: The soft tissues are unremarkable. IMPRESSION: 1. No acute osseous abnormality Electronically signed by: Pinkie Pebbles MD 09/16/2024 11:23 PM EDT RP Workstation: [id number]   DG Wrist Complete Right Result Date: 09/16/2024 EXAM: 4 VIEW(S) XRAY OF THE RIGHT WRIST 09/16/2024 11:18:00 PM COMPARISON: None available. CLINICAL HISTORY: fall. The reason for exam is stated as fall. FINDINGS: BONES AND JOINTS: No acute fracture. No focal osseous lesion. No joint dislocation. SOFT TISSUES: The soft tissues are unremarkable. Vascular calcifications. IMPRESSION: 1. No acute fracture or dislocation. Electronically signed by: Pinkie Pebbles MD 09/16/2024 11:21 PM EDT RP Workstation: [id number]   DG Wrist Complete  Left Result Date: 09/16/2024 EXAM: 4 VIEW(S) XRAY OF THE LEFT WRIST 09/16/2024 11:18:00 PM COMPARISON: None available. CLINICAL HISTORY: Fall. FINDINGS: BONES AND JOINTS: No acute fracture. No focal osseous lesion. No joint dislocation. SOFT TISSUES: The soft tissues are unremarkable. IMPRESSION: 1. No significant abnormality. Electronically signed by: Pinkie Pebbles MD 09/16/2024 11:20 PM EDT RP Workstation: [id number]   DG Ribs Unilateral W/Chest Left Result Date: 09/16/2024 EXAM: AP VIEW(S) XRAY OF THE LEFT RIBS AND CHEST 09/16/2024 08:18:00 PM COMPARISON: Chest x-ray dated 05/24/2016. CLINICAL HISTORY: cp. Pt comes via EMS from home with c/o fall. Pt states he did hit his head. Pt states chest to daughter and she was worried bc he has pacemaker. Pt has skin tear to right hand and right elbow. Pt has hematoma to back of head. Pt is on thinner. Pt also ; states left side pain and back pain. FINDINGS: BONES: Acute displaced left lateral 7, 8, 9, 10 rib fractures. LUNGS AND PLEURA: No consolidation or pulmonary edema. No pleural effusion. No radiographic evidence of significant pneumothorax. Trace left apical pneumothorax. HEART AND MEDIASTINUM: Atherosclerotic plaque. Left chest wall pacemaker. No acute abnormality of the cardiac and mediastinal silhouettes. IMPRESSION: 1. Acute displaced left lateral 7th10th rib fractures. 2. Trace left apical pneumothorax. Finding better evaluated on CT c-spine 09/16/24. Electronically signed by: Morgane Naveau MD 09/16/2024 08:26 PM EDT RP Workstation: [id number]   DG Lumbar Spine 2-3 Views Result Date: 09/16/2024 EXAM: 2 or 3 VIEW(S) XRAY OF THE LUMBAR SPINE 09/16/2024 08:18:00 PM COMPARISON: CT renal dated 08/14/2015. CLINICAL HISTORY: cp. Pt comes via EMS from home with c/o fall. Pt states he did hit his head. Pt has skin tear to right hand and right elbow. Pt is on thinner. Pt also ; states left side pain and back pain. FINDINGS: LUMBAR SPINE: BONES: Interval  development of age-indeterminate T12 compression fracture. No acute fracture of the lumbar spine. Stable Mild retrolisthesis of L2 and L3. Atherosclerotic plaque. Safety pin noted overlying the iliac bone on lateral view which is likely external to patient. At least moderate intervertebral disc space narrowing at the L5-S1 level. No aggressive appearing osseous lesion. Limited evaluation due to overlapping osseous structures and overlying soft tissues. DISCS AND DEGENERATIVE CHANGES: Multilevel moderate degenerative change in the spine. At least mild osseous neural foraminal stenosis at the L4-L5 and L5-S1 levels. SOFT TISSUES: No acute abnormality. IMPRESSION: 1. No acute fracture of the lumbar spine. 2. Interval development of age indeterminate T12 compression fracture. Limited evaluation due to overlapping osseous structures and overlying soft tissues. Correlate with point tenderness to palpation to evaluate for an  acute component. Electronically signed by: Morgane Naveau MD 09/16/2024 08:24 PM EDT RP Workstation: [id number]   CT Head Wo Contrast Result Date: 09/16/2024 EXAM: CT HEAD AND CERVICAL SPINE 09/16/2024 08:03:46 PM TECHNIQUE: CT of the head and cervical spine was performed without the administration of intravenous contrast. Multiplanar reformatted images are provided for review. Automated exposure control, iterative reconstruction, and/or weight based adjustment of the mA/kV was utilized to reduce the radiation dose to as low as reasonably achievable. COMPARISON: None available. CLINICAL HISTORY: Fall. Pt comes via EMS from home with c/o fall. Pt states he did hit his head. Pt states chest to daughter and she was worried bc he has pacemaker. Pt has skin tear to right hand and right elbow. Pt has hematoma to back of head. Pt is on thinner. Pt also states left side pain and back pain. FINDINGS: CT HEAD BRAIN AND VENTRICLES: No acute intracranial hemorrhage. No mass effect or midline shift. No abnormal  extra-axial fluid collection. No evidence of acute infarct. No hydrocephalus. Global cortical atrophy. Subcortical and periventricular small vessel ischemic changes. Intracranial atherosclerosis. ORBITS: No acute abnormality. SINUSES AND MASTOIDS: No acute abnormality. SOFT TISSUES AND SKULL: No acute skull fracture. Hematoma to the back of the head. CT CERVICAL SPINE BONES AND ALIGNMENT: No acute fracture or traumatic malalignment. DEGENERATIVE CHANGES: Mild degenerative changes of the mid cervical spine, most prominent at C5-C6 and C6-C7. SOFT TISSUES: No prevertebral soft tissue swelling. LUNGS: Small left apical pneumothorax. IMPRESSION: 1. No acute intracranial abnormality. 2. No traumatic injury to the cervical spine. 3. Small left apical pneumothorax. Critical Value/emergent results were called by telephone at the time of interpretation on 09/16/2024 at 2010 hrs to provider Dr Ernest. Electronically signed by: Pinkie Pebbles MD 09/16/2024 08:11 PM EDT RP Workstation: [id number]   CT Cervical Spine Wo Contrast Result Date: 09/16/2024 EXAM: CT HEAD AND CERVICAL SPINE 09/16/2024 08:03:46 PM TECHNIQUE: CT of the head and cervical spine was performed without the administration of intravenous contrast. Multiplanar reformatted images are provided for review. Automated exposure control, iterative reconstruction, and/or weight based adjustment of the mA/kV was utilized to reduce the radiation dose to as low as reasonably achievable. COMPARISON: None available. CLINICAL HISTORY: Fall. Pt comes via EMS from home with c/o fall. Pt states he did hit his head. Pt states chest to daughter and she was worried bc he has pacemaker. Pt has skin tear to right hand and right elbow. Pt has hematoma to back of head. Pt is on thinner. Pt also states left side pain and back pain. FINDINGS: CT HEAD BRAIN AND VENTRICLES: No acute intracranial hemorrhage. No mass effect or midline shift. No abnormal extra-axial fluid collection. No  evidence of acute infarct. No hydrocephalus. Global cortical atrophy. Subcortical and periventricular small vessel ischemic changes. Intracranial atherosclerosis. ORBITS: No acute abnormality. SINUSES AND MASTOIDS: No acute abnormality. SOFT TISSUES AND SKULL: No acute skull fracture. Hematoma to the back of the head. CT CERVICAL SPINE BONES AND ALIGNMENT: No acute fracture or traumatic malalignment. DEGENERATIVE CHANGES: Mild degenerative changes of the mid cervical spine, most prominent at C5-C6 and C6-C7. SOFT TISSUES: No prevertebral soft tissue swelling. LUNGS: Small left apical pneumothorax. IMPRESSION: 1. No acute intracranial abnormality. 2. No traumatic injury to the cervical spine. 3. Small left apical pneumothorax. Critical Value/emergent results were called by telephone at the time of interpretation on 09/16/2024 at 2010 hrs to provider Dr Ernest. Electronically signed by: Pinkie Pebbles MD 09/16/2024 08:11 PM EDT RP Workstation: [id number]    Microbiology:  Results for orders placed or performed during the hospital encounter of 09/16/24  Culture, blood (Routine X 2) w Reflex to ID Panel     Status: None (Preliminary result)   Collection Time: 09/18/24  9:30 AM   Specimen: BLOOD  Result Value Ref Range Status   Specimen Description BLOOD left wrist  Final   Special Requests   Final    BOTTLES DRAWN AEROBIC AND ANAEROBIC Blood Culture adequate volume   Culture   Final    NO GROWTH 3 DAYS Performed at [facility], [address] [place], [place] [address]    Report Status PENDING  Incomplete  Culture, blood (Routine X 2) w Reflex to ID Panel     Status: None (Preliminary result)   Collection Time: 09/18/24  9:30 AM   Specimen: BLOOD  Result Value Ref Range Status   Specimen Description BLOOD RIFHT FA  Final   Special Requests   Final    BOTTLES DRAWN AEROBIC AND ANAEROBIC Blood Culture adequate volume   Culture   Final    NO GROWTH 3 DAYS Performed at [facility], [address] [place], [place] [address]    Report Status PENDING  Incomplete    Labs: CBC: Recent Labs  Lab 09/16/24 1849 09/17/24 0155 09/20/24 0311  WBC 12.3* 9.6 8.3  HGB 10.7* 9.9* 9.9*  HCT 32.4* 29.7* 29.8*  MCV 100.0 98.3 98.7  PLT 125* 132* 132*   Basic Metabolic Panel: Recent Labs  Lab 09/16/24 1849 09/17/24 0155 09/18/24 0408 09/20/24 0311  NA 142 142 141 140  K 4.9 4.5 4.7 4.2  CL 109 109 109 107  CO2 22 21* 24 24  GLUCOSE 123* 108* 109* 134*  BUN 46* 42* 38* 41*  CREATININE 1.98* 1.94* 1.55* 1.20  CALCIUM  9.6 9.1 8.9 8.9  MG  --   --  2.3  --    Liver Function Tests: Recent Labs  Lab 09/16/24 1849  AST 25  ALT 27  ALKPHOS 47  BILITOT 0.7  PROT 7.4  ALBUMIN 4.0   CBG: Recent Labs  Lab 09/19/24 0908 09/22/24 0715  GLUCAP 144* 101*    Discharge time spent: 31 minutes.  Signed: Murvin Mana, MD [facility] 09/22/2024

## 2024-09-22 NOTE — Plan of Care (Signed)

## 2024-09-22 NOTE — TOC Transition Note (Signed)
 Transition of Care [facility]) - Discharge Note   Patient Details  Name: Lance Diaz MRN: [id number] Date of Birth: [birth year]  [facility] CM/SW Contact:  Corean ONEIDA Haddock, RN Phone Number: 09/22/2024, 12:36 PM   Clinical Narrative:     Patient will DC un:[name]  Anticipated DC date: 09/22/24  Family notified: Granddaughter harlene and son frank Transport by: granddaughter Marketing executive to send signed scripts and DNR  Per MD patient ready for DC to . RN, patient, patient's family, and facility notified of DC. Discharge Summary sent to facility. RN given number for report.  TOC signing off.         Patient Goals and CMS Choice            Discharge Placement                       Discharge Plan and Services Additional resources added to the After Visit Summary for                                       Social Drivers of Health (SDOH) Interventions SDOH Screenings   Food Insecurity: Unknown (09/17/2024)  Housing: Low Risk  (09/17/2024)  Transportation Needs: No Transportation Needs (09/17/2024)  Utilities: Not At Risk (09/17/2024)  Financial Resource Strain: Low Risk  (09/05/2024)   Received from [facility]  Social Connections: Unknown (09/17/2024)  Tobacco Use: Medium Risk (09/16/2024)     Readmission Risk Interventions     No data to display

## 2024-09-23 LAB — CULTURE, BLOOD (ROUTINE X 2)
Culture: NO GROWTH
Culture: NO GROWTH
Special Requests: ADEQUATE
Special Requests: ADEQUATE

## 2024-12-01 ENCOUNTER — Ambulatory Visit: Admitting: Dermatology

## 2024-12-02 ENCOUNTER — Ambulatory Visit: Admitting: Dermatology

## 2024-12-02 ENCOUNTER — Encounter: Payer: Self-pay | Admitting: Dermatology

## 2024-12-02 DIAGNOSIS — L720 Epidermal cyst: Secondary | ICD-10-CM

## 2024-12-02 NOTE — Progress Notes (Signed)
" ° °  New Patient Visit   Subjective  Lance Diaz is a 89 y.o. male who presents for the following: This patient is accompanied in the office by his granddaughter (power of attorney).  Patient is present for a cyst on the back of his neck that would like removal. 4-5 years ago got some stuff to come out by PCP and has progressed to harden.   Pacemaker for ~ 7 years- Dr Florencio +arthritis   The following portions of the chart were reviewed this encounter and updated as appropriate: medications, allergies, medical history  Review of Systems:  No other skin or systemic complaints except as noted in HPI or Assessment and Plan.  Objective  Well appearing patient in no apparent distress; mood and affect are within normal limits.  A focused examination was performed of the following areas: Posterior neck   Relevant exam findings are noted in the Assessment and Plan.     Assessment & Plan   EPIDERMAL INCLUSION CYST Exam: Subcutaneous nodule at posterior neck  Benign-appearing. Exam most consistent with an epidermal inclusion cyst. Discussed that a cyst is a benign growth that can grow over time and sometimes get irritated or inflamed. Recommend observation if it is not bothersome. Discussed option of surgical excision to remove it if it is growing, symptomatic, or other changes noted. Please call for new or changing lesions so they can be evaluated. Risk of bleeding, infection, recurrence, pain, scar. Will need patient to lie on stomach for an hour. Patient states this is doable. Will not improve neck arthritis.  Sent message to Dr Florencio regarding safety of electrodesiccation/fulguration with pacemaker  EIC (EPIDERMAL INCLUSION CYST)    Return for cyst surgery, w/ Dr. Claudene.  IAlmetta Nora, RMA, am acting as scribe for Boneta Claudene, MD .   Documentation: I have reviewed the above documentation for accuracy and completeness, and I agree with the  above.  Boneta Claudene, MD    "

## 2024-12-02 NOTE — Patient Instructions (Addendum)
 Pre-Operative Instructions You are scheduled for a surgical procedure at [facility]. We recommend you read the following instructions. If you have any questions or concerns, please call the office at [phone].  Shower and wash the entire body with soap and water the day of your surgery paying special attention to cleansing at and around the planned surgery site.  Please continue to take your anticoagulants (blood thinners) as you normally     would before and after surgery if they were prescribed by a medical provider. Stopping them could be harmful to you. We have multiple tools in dermatology to stop the bleeding even if you take an anticoagulant. If you take over the counter blood thinner such as aspirin, Ibuprofen (Motrin, Advil and Nuprin), Naprosyn , Voltaren , Relafen, etc. that was not prescribed or recommended by a medical provider, we recommend that you stop taking it for a week before your surgery and wait to restart until 2 days after your surgery.  Please inform us  of all medications you are currently taking. All medications that are taken regularly should be taken the day of surgery as you always do. Nevertheless, we need to be informed of what medications you are taking prior to surgery to know whether they will affect the procedure or cause any complications.   Please inform us  of any medication allergies. Also inform us  of whether you have allergies to Latex or rubber products or whether you have had any adverse reaction to Lidocaine  or Epinephrine .  Please inform us  of any prosthetic or artificial body parts such as artificial heart valve, joint replacements, etc., or similar condition that might require preoperative antibiotics.   We recommend avoidance of alcohol at least two weeks prior to surgery and continued avoidance for at least two weeks after surgery.   We recommend discontinuation of tobacco smoking at least two weeks prior to surgery and continued  abstinence for at least two weeks after surgery.  Do not plan strenuous exercise, strenuous work or strenuous lifting for approximately four weeks after your surgery.   We request if you are unable to make your scheduled surgical appointment, please call us  at least a week in advance or as soon as you are aware of a problem so that we can cancel or reschedule the appointment.   You MAY TAKE TYLENOL  (acetaminophen ) for pain as it is not a blood thinner.   PLEASE PLAN TO BE IN TOWN FOR TWO WEEKS FOLLOWING SURGERY, THIS IS IMPORTANT SO YOU CAN BE CHECKED FOR DRESSING CHANGES, FUTURE REMOVAL AND TO MONITOR FOR POSSIBLE COMPLICATIONS.   Due to recent changes in healthcare laws, you may see results of your pathology and/or laboratory studies on MyChart before the doctors have had a chance to review them. We understand that in some cases there may be results that are confusing or concerning to you. Please understand that not all results are received at the same time and often the doctors may need to interpret multiple results in order to provide you with the best plan of care or course of treatment. Therefore, we ask that you please give us  2 business days to thoroughly review all your results before contacting the office for clarification. Should we see a critical lab result, you will be contacted sooner.   If You Need Anything After Your Visit  If you have any questions or concerns for your doctor, please call our main line at [phone] and press option 4 to reach your doctor's medical assistant. If  no one answers, please leave a voicemail as directed and we will return your call as soon as possible. Messages left after 4 pm will be answered the following business day.   You may also send us  a message via MyChart. We typically respond to MyChart messages within 1-2 business days.  For prescription refills, please ask your pharmacy to contact our office. Our fax number is [phone].  If you have  an urgent issue when the clinic is closed that cannot wait until the next business day, you can page your doctor at the number below.    Please note that while we do our best to be available for urgent issues outside of office hours, we are not available 24/7.   If you have an urgent issue and are unable to reach us , you may choose to seek medical care at your doctor's office, retail clinic, urgent care center, or emergency room.  If you have a medical emergency, please immediately call 911 or go to the emergency department.  Pager Numbers  - Dr. Hester: [phone]  - Dr. Jackquline: [phone]  - Dr. Claudene: [phone]   - Dr. Raymund: [phone]  In the event of inclement weather, please call our main line at [phone] for an update on the status of any delays or closures.  Dermatology Medication Tips: Please keep the boxes that topical medications come in in order to help keep track of the instructions about where and how to use these. Pharmacies typically print the medication instructions only on the boxes and not directly on the medication tubes.   If your medication is too expensive, please contact our office at [phone] option 4 or send us  a message through MyChart.   We are unable to tell what your co-pay for medications will be in advance as this is different depending on your insurance coverage. However, we may be able to find a substitute medication at lower cost or fill out paperwork to get insurance to cover a needed medication.   If a prior authorization is required to get your medication covered by your insurance company, please allow us  1-2 business days to complete this process.  Drug prices often vary depending on where the prescription is filled and some pharmacies may offer cheaper prices.  The website www.goodrx.com contains coupons for medications through different pharmacies. The prices here do not account for what the cost may be with help from  insurance (it may be cheaper with your insurance), but the website can give you the price if you did not use any insurance.  - You can print the associated coupon and take it with your prescription to the pharmacy.  - You may also stop by our office during regular business hours and pick up a GoodRx coupon card.  - If you need your prescription sent electronically to a different pharmacy, notify our office through [facility] or by phone at [phone] option 4.     Si Usted Necesita Algo Despus de Su Visita  Tambin puede enviarnos un mensaje a travs de Clinical cytogeneticist. Por lo general respondemos a los mensajes de MyChart en el transcurso de 1 a 2 das hbiles.  Para renovar recetas, por favor pida a su farmacia que se ponga en contacto con nuestra oficina. Randi lakes de fax es [address] [phone].  Si tiene un asunto urgente cuando la clnica est cerrada y que no puede esperar hasta el siguiente da hbil, puede llamar/localizar a su doctor(a) al nmero que aparece a continuacin.  Por favor, tenga en cuenta que aunque hacemos todo lo posible para estar disponibles para asuntos urgentes fuera del horario de [place], no estamos disponibles las 24 horas del da, los 7 [address]  [address] de la [place].   Si tiene un problema urgente y no puede comunicarse con nosotros, puede optar por buscar atencin mdica  en el consultorio de su doctor(a), en una clnica privada, en un centro de atencin urgente o en una sala de emergencias.  Si tiene Engineer, drilling, por favor llame inmediatamente al 911 o vaya a la sala de emergencias.  Nmeros de bper  - Dr. Hester: [phone]  - Dra. Jackquline: [phone]  - Dr. Claudene: [phone]  - Dra. Kitts: [phone]  En caso de inclemencias del [place], por favor llame a nuestra lnea principal al [phone] para una actualizacin sobre el estado de cualquier retraso o cierre.  Consejos para la medicacin en dermatologa: Por favor, guarde las  cajas en las que vienen los medicamentos de uso tpico para ayudarle a seguir las instrucciones sobre dnde y cmo usarlos. Las farmacias generalmente imprimen las instrucciones del medicamento slo en las cajas y no directamente en los tubos del [place].   Si su medicamento es muy caro, por favor, pngase en contacto con landry rieger llamando al [phone] y presione la opcin 4 o envenos un mensaje a travs de Clinical cytogeneticist.   No podemos decirle cul ser su copago por los medicamentos por adelantado ya que esto es diferente dependiendo de la cobertura de su seguro. Sin embargo, es posible que podamos encontrar un medicamento sustituto a Audiological scientist un formulario para que el seguro cubra el medicamento que se considera necesario.   Si se requiere una autorizacin previa para que su compaa de seguros malta su medicamento, por favor permtanos de 1 a 2 das hbiles para completar este proceso.  Los precios de los medicamentos varan con frecuencia dependiendo del Environmental consultant de dnde se surte la receta y alguna farmacias pueden ofrecer precios ms baratos.  El sitio web www.goodrx.com tiene cupones para medicamentos de Health and safety inspector. Los precios aqu no tienen en cuenta lo que podra costar con la ayuda del seguro (puede ser ms barato con su seguro), pero el sitio web puede darle el precio si no utiliz Tourist information centre manager.  - Puede imprimir el cupn correspondiente y llevarlo con su receta a la farmacia.  - Tambin puede pasar por nuestra oficina durante el horario de atencin regular y Education officer, museum una tarjeta de cupones de GoodRx.  - Si necesita que su receta se enve electrnicamente a una farmacia diferente, informe a nuestra oficina a travs de MyChart de [place] o por telfono llamando al [phone] y presione la opcin 4.

## 2024-12-19 ENCOUNTER — Emergency Department

## 2024-12-19 ENCOUNTER — Other Ambulatory Visit: Payer: Self-pay

## 2024-12-19 ENCOUNTER — Emergency Department
Admission: EM | Admit: 2024-12-19 | Discharge: 2024-12-19 | Disposition: A | Discharge status: Home / Self Care | Attending: Emergency Medicine | Admitting: Emergency Medicine

## 2024-12-19 DIAGNOSIS — I4891 Unspecified atrial fibrillation: Secondary | ICD-10-CM | POA: Diagnosis not present

## 2024-12-19 DIAGNOSIS — E1122 Type 2 diabetes mellitus with diabetic chronic kidney disease: Secondary | ICD-10-CM | POA: Insufficient documentation

## 2024-12-19 DIAGNOSIS — N183 Chronic kidney disease, stage 3 unspecified: Secondary | ICD-10-CM | POA: Diagnosis not present

## 2024-12-19 DIAGNOSIS — S41112A Laceration without foreign body of left upper arm, initial encounter: Secondary | ICD-10-CM | POA: Diagnosis not present

## 2024-12-19 DIAGNOSIS — R4182 Altered mental status, unspecified: Secondary | ICD-10-CM | POA: Insufficient documentation

## 2024-12-19 DIAGNOSIS — Z23 Encounter for immunization: Secondary | ICD-10-CM | POA: Diagnosis not present

## 2024-12-19 DIAGNOSIS — S4992XA Unspecified injury of left shoulder and upper arm, initial encounter: Secondary | ICD-10-CM | POA: Diagnosis present

## 2024-12-19 DIAGNOSIS — Z7901 Long term (current) use of anticoagulants: Secondary | ICD-10-CM | POA: Diagnosis not present

## 2024-12-19 DIAGNOSIS — S0990XA Unspecified injury of head, initial encounter: Secondary | ICD-10-CM | POA: Insufficient documentation

## 2024-12-19 DIAGNOSIS — W01198A Fall on same level from slipping, tripping and stumbling with subsequent striking against other object, initial encounter: Secondary | ICD-10-CM | POA: Diagnosis not present

## 2024-12-19 DIAGNOSIS — R410 Disorientation, unspecified: Secondary | ICD-10-CM

## 2024-12-19 DIAGNOSIS — W19XXXA Unspecified fall, initial encounter: Secondary | ICD-10-CM

## 2024-12-19 DIAGNOSIS — Z79899 Other long term (current) drug therapy: Secondary | ICD-10-CM | POA: Diagnosis not present

## 2024-12-19 DIAGNOSIS — I129 Hypertensive chronic kidney disease with stage 1 through stage 4 chronic kidney disease, or unspecified chronic kidney disease: Secondary | ICD-10-CM | POA: Diagnosis not present

## 2024-12-19 LAB — URINALYSIS, ROUTINE W REFLEX MICROSCOPIC
Bacteria, UA: NONE SEEN
Bilirubin Urine: NEGATIVE
Glucose, UA: NEGATIVE mg/dL
Hgb urine dipstick: NEGATIVE
Ketones, ur: 5 mg/dL — AB
Leukocytes,Ua: NEGATIVE
Nitrite: NEGATIVE
Protein, ur: 30 mg/dL — AB
Specific Gravity, Urine: 1.021 (ref 1.005–1.030)
pH: 5 (ref 5.0–8.0)

## 2024-12-19 LAB — COMPREHENSIVE METABOLIC PANEL WITH GFR
ALT: 10 U/L (ref 0–44)
AST: 22 U/L (ref 15–41)
Albumin: 3.9 g/dL (ref 3.5–5.0)
Alkaline Phosphatase: 72 U/L (ref 38–126)
Anion gap: 12 (ref 5–15)
BUN: 36 mg/dL — ABNORMAL HIGH (ref 8–23)
CO2: 21 mmol/L — ABNORMAL LOW (ref 22–32)
Calcium: 9.8 mg/dL (ref 8.9–10.3)
Chloride: 109 mmol/L (ref 98–111)
Creatinine, Ser: 1.75 mg/dL — ABNORMAL HIGH (ref 0.61–1.24)
GFR, Estimated: 36 mL/min — ABNORMAL LOW
Glucose, Bld: 84 mg/dL (ref 70–99)
Potassium: 4.2 mmol/L (ref 3.5–5.1)
Sodium: 142 mmol/L (ref 135–145)
Total Bilirubin: 0.6 mg/dL (ref 0.0–1.2)
Total Protein: 7.3 g/dL (ref 6.5–8.1)

## 2024-12-19 LAB — CBC WITH DIFFERENTIAL/PLATELET
Abs Immature Granulocytes: 0.02 K/uL (ref 0.00–0.07)
Basophils Absolute: 0 K/uL (ref 0.0–0.1)
Basophils Relative: 0 %
Eosinophils Absolute: 0 K/uL (ref 0.0–0.5)
Eosinophils Relative: 0 %
HCT: 33.3 % — ABNORMAL LOW (ref 39.0–52.0)
Hemoglobin: 10.5 g/dL — ABNORMAL LOW (ref 13.0–17.0)
Immature Granulocytes: 0 %
Lymphocytes Relative: 25 %
Lymphs Abs: 1.6 K/uL (ref 0.7–4.0)
MCH: 32.5 pg (ref 26.0–34.0)
MCHC: 31.5 g/dL (ref 30.0–36.0)
MCV: 103.1 fL — ABNORMAL HIGH (ref 80.0–100.0)
Monocytes Absolute: 0.7 K/uL (ref 0.1–1.0)
Monocytes Relative: 12 %
Neutro Abs: 3.9 K/uL (ref 1.7–7.7)
Neutrophils Relative %: 63 %
Platelets: 190 K/uL (ref 150–400)
RBC: 3.23 MIL/uL — ABNORMAL LOW (ref 4.22–5.81)
RDW: 13.8 % (ref 11.5–15.5)
WBC: 6.2 K/uL (ref 4.0–10.5)
nRBC: 0 % (ref 0.0–0.2)

## 2024-12-19 LAB — TROPONIN T, HIGH SENSITIVITY
Troponin T High Sensitivity: 47 ng/L — ABNORMAL HIGH (ref 0–19)
Troponin T High Sensitivity: 45 ng/L — ABNORMAL HIGH (ref 0–19)

## 2024-12-19 MED ORDER — SODIUM CHLORIDE 0.9 % IV BOLUS
1000.0000 mL | Freq: Once | INTRAVENOUS | Status: AC
  Administered 2024-12-19: 1000 mL via INTRAVENOUS

## 2024-12-19 MED ORDER — TETANUS-DIPHTH-ACELL PERTUSSIS 5-2-15.5 LF-MCG/0.5 IM SUSP
0.5000 mL | Freq: Once | INTRAMUSCULAR | Status: AC
  Administered 2024-12-19: 0.5 mL via INTRAMUSCULAR
  Filled 2024-12-19: qty 0.5

## 2024-12-19 NOTE — ED Notes (Signed)
 Pt provided discharge instructions and prescription information. Pt was given the opportunity to ask questions and questions were answered.

## 2024-12-19 NOTE — ED Triage Notes (Signed)
 First nurse note: pt to ED [facility] from home. +hit head. +blood thinners. +slurred speech. Pt found by family at 0600 today and was not acting normal. LKW 1830 last night.

## 2024-12-19 NOTE — Discharge Instructions (Addendum)
 Please call your primary care doctor to make a follow-up appointment and discuss this episode and return to the ER if you develop worsening symptoms or any other concerns but at this time his workup was reassuring.  We discussed MRI, pacemaker interrogation which you have opted to decline.  We also offered admission to the hospital.  But at this time he going to monitor his symptoms at home and return if symptoms are changing or worsening.  IMPRESSION: 1. No evidence of acute traumatic injury. 2. 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 Consider nonemergent biopsy for further evaluation.

## 2024-12-19 NOTE — ED Provider Triage Note (Signed)
 Emergency Medicine Provider Triage Evaluation Note  Lance Diaz , a 89 y.o. male  was evaluated in triage.  Pt complains of slurred speech. Patient brought in by his grand daughter. Grand daughter found him sitting on the side of the bed in her grand mother's underwear and two different socks on. Patient fell at some point during the night. Patient does not remember this, he is disoriented.   Review of Systems  Positive: Slurred speech, confusion Negative:   Physical Exam  There were no vitals taken for this visit. Gen:   Awake, no distress   Resp:  Normal effort  MSK:   Moves extremities without difficulty  Other:  Patient oriented to person, place, time  Medical Decision Making  Medically screening exam initiated at 2:02 PM.  Appropriate orders placed.  Lance Diaz was informed that the remainder of the evaluation will be completed by another provider, this initial triage assessment does not replace that evaluation, and the importance of remaining in the ED until their evaluation is complete.     Cleaster Tinnie LABOR, PA-C 12/19/24 1409

## 2024-12-19 NOTE — ED Notes (Signed)
 Applied socks on pt at this time. Pt is in room at this time with call bell by bedside and family is also by bedside with pt.

## 2024-12-19 NOTE — ED Provider Notes (Addendum)
 "  [facility] Provider Note    Event Date/Time   First MD Initiated Contact with Patient 12/19/24 1636     (approximate)   History   Altered Mental Status   HPI  Lance Diaz is a 89 y.o. male  HTN, HLD, diet-controlled diabetes, SSS (s/p of PPM), A-fib on Eliquis , GERD, BPH, CKD-3, carotid artery stenosis who comes in with altered mental status.  Patient reportedly had his head hit and had some confusion and they found him not acting his normal self at 6 AM.  He was last well yesterday night  I reviewed a note from 09/16/2024 where patient was admitted for a small pneumothorax, had delirium thought to be related to head concussion as well as pneumonia  According to family they said that he was very altered today.  They did report that he had a fall and he could have hit his head.  He is stuff was all mixed up over the room.  He seems to be acting his normal self now but I do report that this has happened previously when he had a UTI so they are worried about that.  He has got some skin tears on his left arm noted but he denies any extremity pain or any leg pain.  He denies any abdominal pain, chest pain, shortness of breath.  When this event happened last time where he was altered they thought it was secondary to him being on tramadol  but they deny any new medications.   Physical Exam   Triage Vital Signs: ED Triage Vitals  Encounter Vitals Group     BP 12/19/24 1407 (!) 140/66     Girls Systolic BP Percentile --      Girls Diastolic BP Percentile --      Boys Systolic BP Percentile --      Boys Diastolic BP Percentile --      Pulse Rate 12/19/24 1407 74     Resp 12/19/24 1407 18     Temp 12/19/24 1407 98.7 F (37.1 C)     Temp Source 12/19/24 1407 Oral     SpO2 12/19/24 1407 99 %     Weight 12/19/24 1408 145 lb (65.8 kg)     Height 12/19/24 1408 6' 2 (1.88 m)     Head Circumference --      Peak Flow --      Pain Score 12/19/24 1407 0     Pain  Loc --      Pain Education --      Exclude from Growth Chart --     Most recent vital signs: Vitals:   12/19/24 1407  BP: (!) 140/66  Pulse: 74  Resp: 18  Temp: 98.7 F (37.1 C)  SpO2: 99%     General: Awake, no distress.  CV:  Good peripheral perfusion.  Resp:  Normal effort.  Abd:  No distention.  Soft and nontender Other:  Cranial nerves appear intact.  Equal strength in arms and legs.  Finger-to-nose intact Scattered skin tears noted without any tenderness and full range of motion of all joints.  Able to straight leg both legs.  Able to lift up both arms and move them.  ED Results / Procedures / Treatments   Labs (all labs ordered are listed, but only abnormal results are displayed) Labs Reviewed  COMPREHENSIVE METABOLIC PANEL WITH GFR - Abnormal; Notable for the following components:      Result Value   CO2 21 (*)  BUN 36 (*)    Creatinine, Ser 1.75 (*)    GFR, Estimated 36 (*)    All other components within normal limits  CBC WITH DIFFERENTIAL/PLATELET - Abnormal; Notable for the following components:   RBC 3.23 (*)    Hemoglobin 10.5 (*)    HCT 33.3 (*)    MCV 103.1 (*)    All other components within normal limits  TROPONIN T, HIGH SENSITIVITY - Abnormal; Notable for the following components:   Troponin T High Sensitivity 47 (*)    All other components within normal limits  URINALYSIS, ROUTINE W REFLEX MICROSCOPIC  TROPONIN T, HIGH SENSITIVITY     EKG  My interpretation of EKG:  Normal sinus rate of 73 without any ST elevation or T wave inversion, PVC  RADIOLOGY I have reviewed the ct  personally and interpreted no evidence of intracranial hemorrhage   PROCEDURES:  Critical Care performed: No  Procedures   MEDICATIONS ORDERED IN ED: Medications - No data to display   IMPRESSION / MDM / ASSESSMENT AND PLAN / ED COURSE  I reviewed the triage vital signs and the nursing notes.   Patient's presentation is most consistent with acute  presentation with potential threat to life or bodily function.   Workup was done evaluate for intercranial hemorrhage, electrolyte abnormalities, UTI.  CBC shows normal white count.  Hemoglobin around baseline.  CMP shows creatinine of 1.75 potentially up from his baseline.  Initial troponin is elevated  CT head and neck are negative for acute findings  IMPRESSION: 1. No evidence of acute traumatic injury. 2. 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 Consider nonemergent biopsy for further evaluation.  R shoulder negative  Discussed with family the left parotid mass and they expressed understanding will follow-up outpatient with the ENT as they already follow them  Patient's cranial nerves appear intact I do not see evidence of obvious stroke.  There was report of some slurred speech in triage but patient's speech is normal and it was more of a confusion picture that family had reported.  Family stated he had NO slurred speech.  Patient was able to ambulate although he did feel a little weak family preferred to take him home.  No's concerns for occult fracture.  Discussed MRI.  Patient has a pacemaker that I am not even sure is MRI compatible and even if it was after 5.  Offered admission to the hospital for PT, OT, MRI but they declined.  We also recommended to do an interrogation of pacemaker to ensure no evidence of any arrhythmias.  They deny any history of this and declined wanting to have this done today.  They can understand that a arrhythmia can be life-threatening but given this never been an issue before they prefer not to have this done and prefer to just follow-up outpatient at this time.  Patient is here with the granddaughter who helps make medical decisions for him at this time they decline it, MRI, admission, interrogation of pacemaker.  Did discuss with family and they are going to take away his keys for now and they are going to follow-up with his  primary care doctor before resuming his ability to drive.  They are also going to stay with him for the next 24 hours and work on getting more nursing staff in the home to help if there is any additional issues.  Clinical Course as of 12/19/24 1743  [place] Dec 19, 2024  1710 CT Head Wo Contrast [MF]    Clinical Course User Index [MF] Ernest Ronal BRAVO, MD     FINAL CLINICAL IMPRESSION(S) / ED DIAGNOSES   Final diagnoses:  Confusion  Fall, initial encounter     Rx / DC Orders   ED Discharge Orders     None        Note:  This document was prepared using Dragon voice recognition software and may include unintentional dictation errors.   Ernest Ronal BRAVO, MD 12/19/24 JACKEY    Ernest Ronal BRAVO, MD 12/19/24 1859  "

## 2024-12-19 NOTE — ED Triage Notes (Signed)
 Granddaughter went over to pt's house around lunch time to check on him and he was sitting on the bed in his wife's underwear, confused, and the room was in disarray. Family reports there was blood on the walls and the walker was bent. He had multiple new abrasions. Pt does not remember falling or how the house became messed up. Pt able to identify his family, tell me his birthday, and knows the month/year. Pt has bruises to hands, back of his head, and lip. Pt is on eliquis .

## 2025-01-21 ENCOUNTER — Encounter: Admitting: Dermatology
